# Patient Record
Sex: Male | Born: 1945 | Race: Black or African American | Hispanic: No | Smoking: Former smoker
Health system: Southern US, Community
[De-identification: ages and names within clinical notes are randomized; demographics above are authoritative.]

## PROBLEM LIST (undated history)

## (undated) DIAGNOSIS — N400 Enlarged prostate without lower urinary tract symptoms: Secondary | ICD-10-CM

## (undated) DIAGNOSIS — R339 Retention of urine, unspecified: Secondary | ICD-10-CM

## (undated) DIAGNOSIS — N200 Calculus of kidney: Secondary | ICD-10-CM

## (undated) DIAGNOSIS — E119 Type 2 diabetes mellitus without complications: Secondary | ICD-10-CM

## (undated) DIAGNOSIS — R319 Hematuria, unspecified: Secondary | ICD-10-CM

## (undated) DIAGNOSIS — I10 Essential (primary) hypertension: Secondary | ICD-10-CM

## (undated) HISTORY — DX: Calculus of kidney: N20.0

## (undated) HISTORY — DX: Essential (primary) hypertension: I10

## (undated) HISTORY — DX: Retention of urine, unspecified: R33.9

## (undated) HISTORY — DX: Hematuria, unspecified: R31.9

## (undated) HISTORY — PX: OTHER SURGICAL HISTORY: SHX169

## (undated) HISTORY — DX: Type 2 diabetes mellitus without complications: E11.9

## (undated) HISTORY — DX: Benign prostatic hyperplasia without lower urinary tract symptoms: N40.0

## (undated) HISTORY — PX: COLONOSCOPY: SHX174

---

## 2008-08-19 ENCOUNTER — Emergency Department: Payer: Self-pay | Admitting: Emergency Medicine

## 2011-04-08 ENCOUNTER — Observation Stay: Payer: Self-pay | Admitting: Internal Medicine

## 2014-03-14 ENCOUNTER — Ambulatory Visit: Payer: Self-pay | Admitting: Urology

## 2014-12-01 ENCOUNTER — Inpatient Hospital Stay: Payer: Self-pay | Admitting: Internal Medicine

## 2014-12-01 LAB — URINALYSIS, COMPLETE
Bilirubin,UR: NEGATIVE
Glucose,UR: NEGATIVE mg/dL (ref 0–75)
Ketone: NEGATIVE
NITRITE: NEGATIVE
Ph: 7 (ref 4.5–8.0)
SQUAMOUS EPITHELIAL: NONE SEEN
Specific Gravity: 1.018 (ref 1.003–1.030)

## 2014-12-01 LAB — CBC WITH DIFFERENTIAL/PLATELET
BASOS PCT: 0.5 %
Basophil #: 0.1 10*3/uL (ref 0.0–0.1)
Eosinophil #: 0 10*3/uL (ref 0.0–0.7)
Eosinophil %: 0.1 %
HCT: 39.8 % — ABNORMAL LOW (ref 40.0–52.0)
HGB: 13.1 g/dL (ref 13.0–18.0)
Lymphocyte #: 0.6 10*3/uL — ABNORMAL LOW (ref 1.0–3.6)
Lymphocyte %: 4.8 %
MCH: 28.5 pg (ref 26.0–34.0)
MCHC: 32.9 g/dL (ref 32.0–36.0)
MCV: 87 fL (ref 80–100)
MONO ABS: 0.9 x10 3/mm (ref 0.2–1.0)
MONOS PCT: 7 %
NEUTROS ABS: 11.1 10*3/uL — AB (ref 1.4–6.5)
NEUTROS PCT: 87.6 %
Platelet: 252 10*3/uL (ref 150–440)
RBC: 4.59 10*6/uL (ref 4.40–5.90)
RDW: 13.7 % (ref 11.5–14.5)
WBC: 12.6 10*3/uL — AB (ref 3.8–10.6)

## 2014-12-01 LAB — COMPREHENSIVE METABOLIC PANEL
ALBUMIN: 3.6 g/dL (ref 3.4–5.0)
ANION GAP: 8 (ref 7–16)
Alkaline Phosphatase: 79 U/L
BILIRUBIN TOTAL: 0.6 mg/dL (ref 0.2–1.0)
BUN: 37 mg/dL — ABNORMAL HIGH (ref 7–18)
CHLORIDE: 101 mmol/L (ref 98–107)
Calcium, Total: 9 mg/dL (ref 8.5–10.1)
Co2: 26 mmol/L (ref 21–32)
Creatinine: 2.63 mg/dL — ABNORMAL HIGH (ref 0.60–1.30)
EGFR (Non-African Amer.): 26 — ABNORMAL LOW
GFR CALC AF AMER: 32 — AB
GLUCOSE: 136 mg/dL — AB (ref 65–99)
Osmolality: 281 (ref 275–301)
Potassium: 4.2 mmol/L (ref 3.5–5.1)
SGOT(AST): 29 U/L (ref 15–37)
SGPT (ALT): 29 U/L
Sodium: 135 mmol/L — ABNORMAL LOW (ref 136–145)
TOTAL PROTEIN: 7.6 g/dL (ref 6.4–8.2)

## 2014-12-01 LAB — PROTIME-INR
INR: 1.3
PROTHROMBIN TIME: 15.9 s — AB (ref 11.5–14.7)

## 2014-12-01 LAB — TROPONIN I: Troponin-I: <0.02 ng/mL

## 2014-12-01 LAB — PHOSPHORUS: Phosphorus: 2.5 mg/dL (ref 2.5–4.9)

## 2014-12-01 LAB — MAGNESIUM: MAGNESIUM: 1.5 mg/dL — AB

## 2014-12-02 LAB — BASIC METABOLIC PANEL
Anion Gap: 6 — ABNORMAL LOW (ref 7–16)
BUN: 34 mg/dL — ABNORMAL HIGH (ref 7–18)
CHLORIDE: 107 mmol/L (ref 98–107)
CO2: 25 mmol/L (ref 21–32)
CREATININE: 2.01 mg/dL — AB (ref 0.60–1.30)
Calcium, Total: 7.9 mg/dL — ABNORMAL LOW (ref 8.5–10.1)
EGFR (African American): 43 — ABNORMAL LOW
EGFR (Non-African Amer.): 36 — ABNORMAL LOW
Glucose: 135 mg/dL — ABNORMAL HIGH (ref 65–99)
Osmolality: 285 (ref 275–301)
Potassium: 4.1 mmol/L (ref 3.5–5.1)
Sodium: 138 mmol/L (ref 136–145)

## 2014-12-02 LAB — CBC WITH DIFFERENTIAL/PLATELET
BASOS ABS: 0 10*3/uL (ref 0.0–0.1)
Basophil %: 0.3 %
EOS PCT: 0 %
Eosinophil #: 0 10*3/uL (ref 0.0–0.7)
HCT: 31.9 % — ABNORMAL LOW (ref 40.0–52.0)
HGB: 10.7 g/dL — AB (ref 13.0–18.0)
Lymphocyte #: 1.1 10*3/uL (ref 1.0–3.6)
Lymphocyte %: 10.9 %
MCH: 28.9 pg (ref 26.0–34.0)
MCHC: 33.4 g/dL (ref 32.0–36.0)
MCV: 86 fL (ref 80–100)
Monocyte #: 1 x10 3/mm (ref 0.2–1.0)
Monocyte %: 9.9 %
NEUTROS ABS: 7.8 10*3/uL — AB (ref 1.4–6.5)
NEUTROS PCT: 78.9 %
Platelet: 206 10*3/uL (ref 150–440)
RBC: 3.69 10*6/uL — AB (ref 4.40–5.90)
RDW: 13.8 % (ref 11.5–14.5)
WBC: 9.8 10*3/uL (ref 3.8–10.6)

## 2014-12-02 LAB — MAGNESIUM: Magnesium: 1.6 mg/dL — ABNORMAL LOW

## 2014-12-03 LAB — PSA: PSA: 3.1 ng/mL (ref 0.0–4.0)

## 2014-12-03 LAB — URINE CULTURE

## 2014-12-06 LAB — CULTURE, BLOOD (SINGLE)

## 2015-04-13 NOTE — H&P (Signed)
PATIENT NAME:  Vincent Bautista, Vincent Bautista MR#:  [id number] DATE OF BIRTH:  13-Jan-1946  DATE OF ADMISSION:  12/01/2014  REFERRING DOCTOR: Loraine Leriche R. Fanny Bien, MD  PRIMARY CARE DOCTOR: Abby D. Bender MD  ADMITTING DOCTOR: Crissie Figures, MD   CHIEF COMPLAINT: Came with the complaints of dizziness with a fall with a laceration to the left side of the head.  Found to be hypotensive on arrival to the Emergency Room, with a blood pressure of 74/46.  HISTORY OF PRESENT ILLNESS: A 69 year old African American male with a past medical history significant for hypertension, diabetes mellitus type 2, hyperlipidemia, benign prostatic hypertrophy. He came with the complaints of a fall following dizziness while he stood up, and sustained injury to the left side of the head. The patient stated that he has been having some ongoing urinary retention problems for the past few months which kind of increased this morning. Hence, he went to the [facility] Emergency Room, where he was evaluated by the ED physician and was placed a Foley catheter and was diagnosed with a urinary tract infection and the patient was sent home on p.o. Cipro and finasteride and was advised to be followed up by a urologist and his primary care doctor. The patient went home and was doing, apparently, well, and this evening while he was trying to stand up he suddenly felt dizzy and he fell down and sustained injury to the left side of the forehead. The patient did not lose any consciousness. Denies any chest pain, shortness of breath. No fever, no chills. No nausea, no vomiting, no diarrhea. As mentioned earlier, the patient has been having retention of the urine for the past few months and being evaluated by his primary care physician. In the Emergency Room, the patient on arrival was found to be hypotensive, with a blood pressure of 74/46, and the workup revealed urinary tract infection with elevated white blood cell count of 12.6 and elevated creatinine of 2.6. The  patient was given IV fluids, following which his blood pressure improved, and currently his blood pressure is 101/38. The patient denies any dizziness at this time and he is comfortably lying in the bed. Denies any other complaints such as chest pain, shortness of breath, palpitations. The patient has a Foley placed which is draining some dark-colored urine.  PAST MEDICAL HISTORY: 1.  Diabetes mellitus type 2.  2.  Hypertension.  3.  Hyperlipidemia.  4.  Benign prostatic hypertrophy.   PAST SURGICAL HISTORY: Nil significant.   ALLERGIES: CODEINE.   HOME MEDICATIONS: 1.  Lisinopril/hydrochlorothiazide 20/12.5 mg 2 tablets orally per day.  2.  Metformin 500 mg 1 tablet orally twice a day.  3.  Simvastatin 20 mg tablet 1 tablet orally once a day.  4.  Finasteride 5 mg tablet 1 tablet orally once a day, which has not started. The patient also did not start Cipro which was given at [facility] Emergency Room earlier today.  FAMILY HISTORY: Mother with CVA, hypertension, and heart disease. Father with colon cancer. Brother with prostate cancer and colon cancer and another brother with heart disease.   SOCIAL HISTORY: He is single. He works as a Engineer, water in the buildings. No history of smoking, alcohol or substance abuse.  REVIEW OF SYSTEMS: CONSTITUTIONAL: Negative for fever. He felt dizzy and weak while standing up in the evening at home, following which he fell down and sustained injury to the left side of the forehead.  EYES: Negative for blurred vision, double vision. No pain.  No redness. No inflammation.  EARS, NOSE, AND THROAT: Negative for tinnitus, ear pain, hearing loss. No epistaxis. No discharge. No difficulty swallowing.  RESPIRATORY: Negative for cough, wheezing, hemoptysis, dyspnea, painful respirations.  CARDIOVASCULAR: Negative for chest pain, orthopnea, palpitations, syncope. No pedal edema. As mentioned in the HPI, he felt dizzy when he stood up, but did not lose consciousness.   GASTROINTESTINAL: Negative for nausea, vomiting, diarrhea, abdominal pain, hematemesis, melena, GERD symptoms, or rectal bleeding.  GENITOURINARY: Ongoing urinary retention for the past few months, and he has been having intermittent dysuria for which he went to the [facility] Emergency Room today, was evaluated by the ED physician and was found to have acute urinary retention and urinary tract infection for which a Foley catheter was placed and he was prescribed Cipro. Currently having Foley catheter, which is draining dark-colored urine.  ENDOCRINE: Negative for polyuria, nocturia. No heat or cold intolerance.  HEMATOLOGIC: Negative for anemia, easy bruising or bleeding, swollen glands.  INTEGUMENTARY: Negative for acne, skin rash, lesions.  MUSCULOSKELETAL: Negative for arthritis, joint swelling, gout.  NEUROLOGICAL: Negative for focal weakness or numbness. No history of CVA, TIA, seizure disorder.  PSYCHIATRIC: Negative for anxiety, insomnia, depression.   PHYSICAL EXAMINATION:  VITAL SIGNS: On arrival to the Emergency Room, temperature of 98.6 degrees Fahrenheit, pulse rate 88 per minute, respirations 19, blood pressure 74/46, oxygen saturation is 99% on room air. Current blood pressure 101/38, pulse rate 80 per minute, respirations 16 per minute.  GENERAL: Well developed, well nourished, alert, awake, and oriented, very pleasant and cooperative for examination. In no acute distress, comfortably lying in the bed.  HEAD: A 2-inch long, superficial lacerated injury over the left eyebrow present. No active bleeding.  EYES: Pupils are equal and reactive to light. No conjunctival pallor. No scleral icterus. Extraocular movements intact.  NOSE: No nasal lesions. No drainage.  EARS: No drainage. No external lesions.  ORAL CAVITY: No mucosal lesions. No exudates. Poor oral dentition.  NECK: Supple. No JVD. No thyromegaly. No carotid bruit. Range of motion of neck normal. RESPIRATORY: Good respiratory  effort. Clear to auscultation bilaterally. No rales or rhonchi.  CARDIOVASCULAR: S1, S2 regular. No murmurs, clicks, or gallops appreciated. Peripheral pulses equal at carotid, femoral, and pedal pulses. No peripheral edema.  GASTROINTESTINAL: Abdomen soft, nontender. No hepatosplenomegaly. Bowel sounds present and equal in all 4 quadrants. No rebound. No guarding. No rigidity.  GENITOURINARY: Deferred.  MUSCULOSKELETAL: Range of motion adequate in all areas. Strength and tone equal bilaterally in both upper and lower extremities.  SKIN: Inspection within normal limits.  LYMPHATIC: No cervical lymphadenopathy.  VASCULAR: Good dorsalis pedis and posterior tibial pulses.  NEUROLOGICAL: Alert, awake, and oriented x 3. Cranial nerves II through XII grossly intact. DTRs are 2+ bilaterally and symmetrical. Motor strength is 5/5 in both upper and lower extremities.  PSYCHIATRIC: Judgment and insight are adequate. Alert and oriented x 3. Memory and mood within normal limits.   LABORATORY DATA: Serum glucose 136, BUN 37, creatinine 2.63, stool in one, sodium 135, potassium 4.2, chloride 101, bicarbonate 26, estimated GFR 32, total calcium 9.0, serum phosphorus 2.5, magnesium 1.5, total protein 7.6, albumin 3.6, total bilirubin 0.6, alkaline phosphatase 79, AST 20 and ALT 29. Troponin less than 0.02. WBC 12.6, hemoglobin 13.1, hematocrit 39.8, platelet count 252,000. Neutrophils 11.1, elevated. Prothrombin time 15.9, INR 1.3. Urinalysis: Turbid, 2+ blood. Protein 100 mg. RBCs 852 per high-power field, WBC 2411, bacteria 1+, WBC present.  Lactic acid venous 2.5.   IMAGING STUDIES:  Chest x-ray: No active disease.   CT of the head, noncontrast study: No acute intracranial abnormalities. Mild chronic atrophy and small-vessel ischemic changes.   EKG: Normal sinus rhythm with ventricular rate of 92 beats per minute. Nonspecific ST-T changes.   ASSESSMENT AND PLAN: A 69 year old African American male with a  past medical history of diabetes mellitus type 2, hypertension, hyperlipidemia, benign prostatic hypertrophy who presents with complaints of dizziness with subsequent fall with injury to the left side of the face, was found to be hypotensive on arrival, further workup revealed urosepsis and hypotension secondary to sepsis. 1.  Sepsis that is urosepsis secondary due to urinary tract infection. Plan: Admit to medical floor. Pan cultures. Continue intravenous Zosyn. Follow cultures and modify antibiotics accordingly. 2.  Hypotension secondary due to urosepsis. Received intravenous fluids in the Emergency Room, following which blood pressure improving. The patient without any symptoms at this time. Continue intravenous hydration. Follow blood pressure monitoring. We hold all blood pressure medications for now.  3.  Ongoing urinary retention with a history of benign prostatic hypertrophy for the past few months. [facility] and a Foley was placed, and was also diagnosed to have a UTI. Continue Foley catheter with drainage. Check PSA and order KUB of ultrasound and further workup depending on the results. May need urology consultation.  4.  Renal failure. Not sure whether acute or chronic. Prior creatinine not known. Most likely in acute renal failure. Plan: Check ultrasound of KUB. Avoid nephrotoxic agents. IV hydration. Monitor BMP closely.  5.  History of hypertension. Will hold all blood pressure medications because of hypotension for now. Monitor blood pressure closely and consider restarting blood pressure medications when blood pressure is better.  6.  Diabetes mellitus type 2 on metformin. Will hold off metformin because of acute kidney injury. Will place him on sliding scale insulin. Monitor blood sugars closely. Follow up accordingly. 7.  History of hyperlipidemia on simvastatin. Continue same.  8.  Hypomagnesemia. Replace magnesium. Follow up levels. 9.  Deep vein thrombosis  prophylaxis. Subcutaneous Lovenox.  10.  Gastrointestinal prophylaxis. Protonix.   CODE STATUS: Full code.   TIME SPENT: 55 minutes.   ____________________________ Crissie FiguresEdavally N. Katlynne Mckercher, MD enr:ST D: 12/02/2014 00:09:47 ET T: 12/02/2014 00:47:20 ET JOB#: [id number]  cc: Crissie FiguresEdavally N. Tom Macpherson, MD, <Dictator> Abby D. Hessie DienerBender MD Crissie FiguresEDAVALLY N Merlean Pizzini MD ELECTRONICALLY SIGNED 12/02/2014 18:53

## 2015-04-17 NOTE — Discharge Summary (Signed)
PATIENT NAME:  Vincent Bautista, [name] MR#:  [id number] DATE OF BIRTH:  04-18-1946  DATE OF ADMISSION:  12/01/2014 DATE OF DISCHARGE:  12/03/2014  PRESENTING COMPLAINT: Dizziness with fall and laceration of the left side of the head.   PRIMARY CARE PHYSICIAN: Abby D. Hessie DienerBender, MD.   DISCHARGE DIAGNOSES: 1. Acute cystitis.  2. Hypertension with falls.  3. Benign prostatic hypertrophy.  4. 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   CODE STATUS: Full code.   MEDICATIONS: 1. Aspirin 81 mg daily.  2. Pravachol 20 mg at bedtime.  3. Finasteride 5 mg daily.  4. Hydrochlorothiazide/lisinopril 12.5/20 one tablet daily.  5. Glipizide 5 mg daily.  6. Keflex 500 mg b.i.d.   FOLLOW UP: With your primary care physician in 1 to 2 weeks.   LABORATORY DATA: At discharge: Hemoglobin and hematocrit is 10.7 and 31.9. Creatinine is 2.01, sodium is 138, potassium is 4.1. Blood cultures negative in 5 days. White count is 12.6. Phosphorus 2.5. UA positive for UTI. Urine culture negative in 36 hours.   DIAGNOSTIC DATA: CT of the head: Mild diffuse cerebral atrophy. No intracranial abnormalities. EKG shows sinus rhythm, nonspecific ST-T abnormality.   HOSPITAL COURSE: [name] is a 69 year old gentleman with history of dizziness and fall, hypotensive on arrival. [facility], diagnosed to have UTI and sent home with antibiotics. He came in with symptoms of urinary retention, ongoing for the past few months. He was admitted with:  1. Sepsis secondary to UTI with hypotension and elevated white count. He was started on IV Rocephin and changed to p.o. Keflex at discharge. White count is normal. No fever documented. His urine culture did not grow anything after 36 hours.  2. Hypotension secondary to sepsis. Received IV fluids. Blood pressure improved. Resumed hydrochlorothiazide and lisinopril daily. His dose was adjusted to 1 tablet daily.  3. Ongoing urinary retention with history of BPH. The patient will  Foley catheter in place. Continue Foley. Will set up an appointment with Dr. Vanna ScotlandAshley Brandon as outpatient for urology follow up.  4. Renal failure, acute, appears most likely chronic although prior creatinine is not known. IV hydration was continued. The patient [facility] check on his metabolic panel after discharge.  5. Hypertension, on his home medications. Dosage was adjusted.  6. Type 2 diabetes. Metformin was discontinued secondary to elevated creatinine and p.o. glipizide was started.  7. Hyperlipidemia, on simvastatin.  8. Hypomagnesemia. Magnesium was replaced.   Hospital stay otherwise remained stable. The patient remained a full code.   TIME SPENT: 40 minutes.    ____________________________ Wylie HailSona A. Allena KatzPatel, MD sap:TT D: 12/18/2014 17:24:22 ET T: 12/18/2014 19:08:07 ET JOB#: [id number]  cc: Lachlyn Vanderstelt A. Allena KatzPatel, MD, <Dictator> Abby D. Hessie DienerBender MD Willow OraSONA A Burrell Hodapp MD ELECTRONICALLY SIGNED 12/25/2014 11:08

## 2015-06-03 ENCOUNTER — Telehealth: Payer: Self-pay

## 2015-06-03 NOTE — Telephone Encounter (Signed)
Can pt have refills on tamsulosin? Pt has not been seen in12/23/15. Please adivse. Cw,lpn

## 2015-06-03 NOTE — Telephone Encounter (Signed)
Per Dr. Delana Meyer note, patient was to follow-up in 2 months he did not. Okay to refill tamsulosin for 1 month but patient must make an appointment in month.

## 2015-06-03 NOTE — Telephone Encounter (Signed)
Pt hung up on nurse when trying to ask about medication and needing refills. Cw,lpn

## 2015-06-06 NOTE — Telephone Encounter (Signed)
Unable to reach pt. Therefore a letter was sent in reference to medication. Cw,lpn

## 2016-04-20 ENCOUNTER — Ambulatory Visit: Payer: Self-pay | Admitting: Urology

## 2016-05-29 ENCOUNTER — Encounter: Payer: Self-pay | Admitting: *Deleted

## 2016-06-01 ENCOUNTER — Encounter: Payer: Self-pay | Admitting: Urology

## 2016-06-01 ENCOUNTER — Ambulatory Visit (INDEPENDENT_AMBULATORY_CARE_PROVIDER_SITE_OTHER): Payer: Medicare Other | Admitting: Urology

## 2016-06-01 VITALS — BP 132/85 | HR 90 | Ht 64.0 in | Wt 158.0 lb

## 2016-06-01 DIAGNOSIS — Z87898 Personal history of other specified conditions: Secondary | ICD-10-CM

## 2016-06-01 DIAGNOSIS — N401 Enlarged prostate with lower urinary tract symptoms: Secondary | ICD-10-CM

## 2016-06-01 DIAGNOSIS — N400 Enlarged prostate without lower urinary tract symptoms: Secondary | ICD-10-CM | POA: Diagnosis not present

## 2016-06-01 DIAGNOSIS — R338 Other retention of urine: Principal | ICD-10-CM

## 2016-06-01 DIAGNOSIS — N528 Other male erectile dysfunction: Secondary | ICD-10-CM | POA: Diagnosis not present

## 2016-06-01 DIAGNOSIS — Z87448 Personal history of other diseases of urinary system: Secondary | ICD-10-CM

## 2016-06-01 DIAGNOSIS — N529 Male erectile dysfunction, unspecified: Secondary | ICD-10-CM

## 2016-06-01 LAB — BLADDER SCAN AMB NON-IMAGING

## 2016-06-01 MED ORDER — TAMSULOSIN HCL 0.4 MG PO CAPS: 0.4000 mg | ORAL_CAPSULE | Freq: Every day | ORAL | Status: DC

## 2016-06-01 MED ORDER — FINASTERIDE 5 MG PO TABS: 5.0000 mg | ORAL_TABLET | Freq: Every day | ORAL | Status: DC

## 2016-06-01 NOTE — Progress Notes (Addendum)
06/01/2016 3:44 PM   Vincent Bautista Vincent Bautista 1946/02/29 [id number]  Referring provider: No referring provider defined for this encounter.  Chief Complaint  Patient presents with  . Benign Prostatic Hypertrophy    follow up    HPI: Patient is a 70 year old African American male who presents today as a referral from his primary care physician Dr. Tonia Bautista for follow-up for history of urinary retention, erectile dysfunction and BPH with LUTS.  History of urinary retention Patient was found to be in urinary retention after suffering a TIA in December 2015. Patient was initiated on finasteride and Flomax. He did undergo cystoscopically in June 2015 and was found to have an enlarged prostate and a bladder diverticulum with hypospadias.  He states he is voiding well, but his PVR at today's exam is 514 mL.  I do not have a recent serum creatinine available to me at this visit.  Erectile dysfunction His SHIM score is 8, which is moderate ED.   He has been having difficulty with erections for the last several years.   His major complaint is achieving an erection.  His libido is preserved.   His risk factors for ED are age, BPH, HTN, and DM.  He denies any painful erections or curvatures with his erections.   He has tried Viagra in the past unsatisfactory results.       SHIM      06/01/16 1524       SHIM: Over the last 6 months:   How do you rate your confidence that you could get and keep an erection? Low     When you had erections with sexual stimulation, how often were your erections hard enough for penetration (entering your partner)? A Few Times (much less than half the time)     During sexual intercourse, how often were you able to maintain your erection after you had penetrated (entered) your partner? Very Difficult     During sexual intercourse, how difficult was it to maintain your erection to completion of intercourse? Extremely Difficult     When you attempted sexual intercourse, how  often was it satisfactory for you? Extremely Difficult     SHIM Total Score   SHIM 8        Score: 1-7 Severe ED 8-11 Moderate ED 12-16 Mild-Moderate ED 17-21 Mild ED 22-25 No ED   BPH WITH LUTS His IPSS score today is 0, which is no lower urinary tract symptomatology. He is mostly satisfied with his quality life due to his urinary symptoms. His PVR is 514 mL.  He denies any dysuria, hematuria or suprapubic pain.  He currently taking tamsulosin and finasteride.  He also denies any recent fevers, chills, nausea or vomiting.  He has a family history of prostate cancer in his brother.        IPSS      06/01/16 1500       International Prostate Symptom Score   How often have you had the sensation of not emptying your bladder? Not at All     How often have you had to urinate less than every two hours? Not at All     How often have you found you stopped and started again several times when you urinated? Not at All     How often have you found it difficult to postpone urination? Not at All     How often have you had a weak urinary stream? Not at All  How often have you had to strain to start urination? Not at All     How many times did you typically get up at night to urinate? None     Total IPSS Score 0     Quality of Life due to urinary symptoms   If you were to spend the rest of your life with your urinary condition just the way it is now how would you feel about that? Mostly Satisfied        Score:  1-7 Mild 8-19 Moderate 20-35 Severe     PMH: Past Medical History  Diagnosis Date  . Diabetes mellitus, type 2 (HCC)   . HTN (hypertension)   . Bilateral kidney stones   . Hematuria     gross  . Urinary retention   . BPH (benign prostatic hypertrophy)     Surgical History: Past Surgical History  Procedure Laterality Date  . None      Home Medications:    Medication List       This list is accurate as of: 06/01/16  3:44 PM.  Always use your most recent med  list.               aspirin 81 MG tablet  Take 81 mg by mouth daily.     finasteride 5 MG tablet  Commonly known as:  PROSCAR  Take 1 tablet (5 mg total) by mouth daily.     glipiZIDE 5 MG tablet  Commonly known as:  GLUCOTROL     hydrochlorothiazide 25 MG tablet  Commonly known as:  HYDRODIURIL     lisinopril 10 MG tablet  Commonly known as:  PRINIVIL,ZESTRIL     simvastatin 20 MG tablet  Commonly known as:  ZOCOR     tamsulosin 0.4 MG Caps capsule  Commonly known as:  FLOMAX  Take 1 capsule (0.4 mg total) by mouth daily.        Allergies:  Allergies  Allergen Reactions  . Codeine     Family History: Family History  Problem Relation Age of Onset  . Colon cancer Brother   . Cancer    . Prostate cancer Brother   . Lung cancer Father     Social History:  reports that he has quit smoking. He does not have any smokeless tobacco history on file. He reports that he does not drink alcohol. His drug history is not on file.  ROS: UROLOGY Frequent Urination?: No Hard to postpone urination?: No Burning/pain with urination?: No Get up at night to urinate?: No Leakage of urine?: No Urine stream starts and stops?: No Trouble starting stream?: No Do you have to strain to urinate?: No Blood in urine?: No Urinary tract infection?: No Sexually transmitted disease?: No Injury to kidneys or bladder?: No Painful intercourse?: No Weak stream?: No Erection problems?: No Penile pain?: No  Gastrointestinal Nausea?: No Vomiting?: No Indigestion/heartburn?: No Diarrhea?: No Constipation?: No  Constitutional Fever: No Night sweats?: No Weight loss?: No Fatigue?: No  Skin Skin rash/lesions?: No Itching?: No  Eyes Blurred vision?: No Double vision?: No  Ears/Nose/Throat Sore throat?: No Sinus problems?: No  Hematologic/Lymphatic Swollen glands?: No Easy bruising?: No  Cardiovascular Leg swelling?: No Chest pain?: No  Respiratory Cough?:  No Shortness of breath?: Yes  Endocrine Excessive thirst?: No  Musculoskeletal Back pain?: No Joint pain?: No  Neurological Headaches?: No Dizziness?: No  Psychologic Depression?: No Anxiety?: No  Physical Exam: BP 132/85 mmHg  Pulse 90  Ht 5\' 4"  (  1.626 m)  Wt 158 lb (71.668 kg)  BMI 27.11 kg/m2  Constitutional: Well nourished. Alert and oriented, No acute distress. HEENT: [place] AT, moist mucus membranes. Trachea midline, no masses. Cardiovascular: No clubbing, cyanosis, or edema. Respiratory: Normal respiratory effort, no increased work of breathing. GI: Abdomen is soft, non tender, non distended, no abdominal masses. Liver and spleen not palpable.  No hernias appreciated.  Stool sample for occult testing is not indicated.   GU: No CVA tenderness.  No bladder fullness or masses.  Patient with circumcised phallus. Coronal hypospadies.   Urethral meatus is patent.  No penile discharge. No penile lesions or rashes. Scrotum without lesions, cysts, rashes and/or edema.  Testicles are located scrotally bilaterally. No masses are appreciated in the testicles. Left and right epididymis are normal. Rectal: Patient with  normal sphincter tone. Anus and perineum without scarring or rashes. No rectal masses are appreciated. Prostate is approximately 60 grams, DRE limited to patient's body habitus, no nodules are appreciated. Seminal vesicles are normal. Skin: No rashes, bruises or suspicious lesions. Lymph: No cervical or inguinal adenopathy. Neurologic: Grossly intact, no focal deficits, moving all 4 extremities. Psychiatric: Normal mood and affect.  Laboratory Data: Lab Results  Component Value Date   WBC 9.8 12/02/2014   HGB 10.7* 12/02/2014   HCT 31.9* 12/02/2014   MCV 86 12/02/2014   PLT 206 12/02/2014    Lab Results  Component Value Date   CREATININE 2.01* 12/02/2014    Lab Results  Component Value Date   PSA 3.1 12/01/2014     Lab Results  Component Value Date    AST 29 12/01/2014   Lab Results  Component Value Date   ALT 29 12/01/2014     Pertinent Imaging: Results for AYAAN, SHUTES (MRN [id number]) as of 06/02/2016 23:27  Ref. Range 06/01/2016 15:14  Scan Result Unknown    Assessment & Plan:    1. History of urinary retention:   Patient had an episode of urinary retention in 2015. A cystoscopy noted an enlarged prostate with bladder diverticulum. He is still on finasteride and Flomax, but his PVR is still large.  He is not wanting a Foley catheter placed at this time. I will have the patient stop tamsulosin and start Cialis 5 mg samples to see if this can help facilitate better emptying of his bladder.   2. Erectile dysfunction:   SHIM score is 8.   I explained to the patient that in order to achieve an erection it takes good functioning of the nervous system (parasympathetic, sympathetic, sensory and motor), good blood flow into the erectile tissue of the penis and a desire to have sex.   I stated that conditions like diabetes, hypertension, coronary artery disease, peripheral vascular disease, smoking, alcohol consumption, age and BPH can diminish the ability to have an erection.   We discussed trying a different PDE5 inhibitor, intra-urethral suppositories, intracavernous vasoactive drug injection therapy, vacuum constriction device and penile prosthesis implantation.  We discussed trying a different PDE5 inhibitor, intra-urethral suppositories, intracavernous vasoactive drug injection therapy, vacuum constriction device and penile prosthesis implantation.  We will try Cialis at this time. He'll be returning in 1 month for [facility] IM score and exam.   3. BPH (benign prostatic hyperplasia) with urinary retention:   IPSS score is 0/2.  I explained to the patient the ramifications of having an large postvoid residual, such as urinary tract infections, bladder calculi and renal failure. I will be checking a BUN and creatinine  at today's visit. He will  continue the finasteride 5 mg daily, he will discontinue the tamsulosin 0.4 mg daily and start Cialis 5 mg daily to see if we can reduce his residuals. He will follow-up in one month for I PSS score and PVR.  - PSA - BLADDER SCAN AMB NON-IMAGING   Return in about 1 month (around 07/01/2016) for IPSS, SHIM and PVR.  These notes generated with voice recognition software. I apologize for typographical errors.  Michiel CowboySHANNON Janit Cutter, PA-C  [facility] [address], [address] [place], [place] [address] [phone]

## 2016-06-02 ENCOUNTER — Telehealth: Payer: Self-pay

## 2016-06-02 LAB — BUN+CREAT
BUN/Creatinine Ratio: 16 (ref 10–24)
BUN: 21 mg/dL (ref 8–27)
CREATININE: 1.32 mg/dL — AB (ref 0.76–1.27)
GFR calc Af Amer: 64 mL/min/{1.73_m2} (ref >59)
GFR calc non Af Amer: 55 mL/min/{1.73_m2} — ABNORMAL LOW (ref >59)

## 2016-06-02 LAB — PSA: Prostate Specific Ag, Serum: 0.2 ng/mL (ref 0.0–4.0)

## 2016-06-02 NOTE — Telephone Encounter (Signed)
-----   Message from Harle BattiestShannon A McGowan, PA-C sent at 06/02/2016  8:09 AM EDT ----- Lab work is normal.  We will see him in one month.

## 2016-06-02 NOTE — Telephone Encounter (Signed)
Spoke with pt in reference lab results. Pt voiced understanding.  

## 2016-06-14 ENCOUNTER — Emergency Department
Admission: EM | Admit: 2016-06-14 | Discharge: 2016-06-14 | Disposition: A | Discharge status: Home / Self Care | Payer: Medicare Other | Attending: Emergency Medicine | Admitting: Emergency Medicine

## 2016-06-14 DIAGNOSIS — Z87891 Personal history of nicotine dependence: Secondary | ICD-10-CM | POA: Insufficient documentation

## 2016-06-14 DIAGNOSIS — R350 Frequency of micturition: Secondary | ICD-10-CM | POA: Diagnosis present

## 2016-06-14 DIAGNOSIS — Z7984 Long term (current) use of oral hypoglycemic drugs: Secondary | ICD-10-CM | POA: Insufficient documentation

## 2016-06-14 DIAGNOSIS — R338 Other retention of urine: Secondary | ICD-10-CM

## 2016-06-14 DIAGNOSIS — I10 Essential (primary) hypertension: Secondary | ICD-10-CM | POA: Diagnosis not present

## 2016-06-14 DIAGNOSIS — Z79899 Other long term (current) drug therapy: Secondary | ICD-10-CM | POA: Insufficient documentation

## 2016-06-14 DIAGNOSIS — N401 Enlarged prostate with lower urinary tract symptoms: Secondary | ICD-10-CM | POA: Diagnosis not present

## 2016-06-14 DIAGNOSIS — N3090 Cystitis, unspecified without hematuria: Secondary | ICD-10-CM | POA: Diagnosis not present

## 2016-06-14 DIAGNOSIS — Z7982 Long term (current) use of aspirin: Secondary | ICD-10-CM | POA: Insufficient documentation

## 2016-06-14 DIAGNOSIS — E119 Type 2 diabetes mellitus without complications: Secondary | ICD-10-CM | POA: Diagnosis not present

## 2016-06-14 LAB — COMPREHENSIVE METABOLIC PANEL
ALT: 16 U/L — ABNORMAL LOW (ref 17–63)
AST: 21 U/L (ref 15–41)
Albumin: 4 g/dL (ref 3.5–5.0)
Alkaline Phosphatase: 78 U/L (ref 38–126)
Anion gap: 9 (ref 5–15)
BUN: 24 mg/dL — ABNORMAL HIGH (ref 6–20)
CO2: 27 mmol/L (ref 22–32)
Calcium: 9.3 mg/dL (ref 8.9–10.3)
Chloride: 103 mmol/L (ref 101–111)
Creatinine, Ser: 1.39 mg/dL — ABNORMAL HIGH (ref 0.61–1.24)
GFR calc Af Amer: 59 mL/min — ABNORMAL LOW (ref >60)
GFR calc non Af Amer: 51 mL/min — ABNORMAL LOW (ref >60)
Glucose, Bld: 119 mg/dL — ABNORMAL HIGH (ref 65–99)
Potassium: 3.4 mmol/L — ABNORMAL LOW (ref 3.5–5.1)
Sodium: 139 mmol/L (ref 135–145)
Total Bilirubin: 0.4 mg/dL (ref 0.3–1.2)
Total Protein: 7.2 g/dL (ref 6.5–8.1)

## 2016-06-14 LAB — URINALYSIS COMPLETE WITH MICROSCOPIC (ARMC ONLY)
Bilirubin Urine: NEGATIVE
GLUCOSE, UA: NEGATIVE mg/dL
Ketones, ur: NEGATIVE mg/dL
Nitrite: NEGATIVE
PROTEIN: NEGATIVE mg/dL
SQUAMOUS EPITHELIAL / LPF: NONE SEEN
Specific Gravity, Urine: 1.017 (ref 1.005–1.030)
pH: 5 (ref 5.0–8.0)

## 2016-06-14 MED ORDER — CEPHALEXIN 500 MG PO CAPS: 500.0000 mg | ORAL_CAPSULE | Freq: Two times a day (BID) | ORAL | Status: DC

## 2016-06-14 MED ORDER — CEPHALEXIN 500 MG PO CAPS
500.0000 mg | ORAL_CAPSULE | Freq: Once | ORAL | Status: AC
  Administered 2016-06-14: 500 mg via ORAL
  Filled 2016-06-14: qty 1

## 2016-06-14 NOTE — ED Notes (Signed)
Patient is changing into gown.

## 2016-06-14 NOTE — ED Notes (Signed)
Patient educated on Foley use and need for frequent dumping of urine from leg bag.  Patient voiced understanding.

## 2016-06-14 NOTE — ED Provider Notes (Addendum)
[facility] Emergency Department Provider Note  ____________________________________________  Time seen: 1:30 AM  I have reviewed the triage vital signs and the nursing notes.   HISTORY  Chief Complaint Abdominal Pain and Urinary Retention    HPI Vincent Bautista is a 70 y.o. male complaining of gradual onset worsening suprapubic pain. Nonradiating. No aggravating or alleviating factors. Has frequent urination of small volumes. Recently saw urology was restarted on finasteride, also Cialis for erectile dysfunction. He has a history of hypospadias. 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   No fevers chills chest pain shortness of breath or vomiting.    Past Medical History  Diagnosis Date  . Diabetes mellitus, type 2 (HCC)   . HTN (hypertension)   . Bilateral kidney stones   . Hematuria     gross  . Urinary retention   . BPH (benign prostatic hypertrophy)      There are no active problems to display for this patient.    Past Surgical History  Procedure Laterality Date  . None       Current Outpatient Rx  Name  Route  Sig  Dispense  Refill  . aspirin 81 MG tablet   Oral   Take 81 mg by mouth daily.         . cephALEXin (KEFLEX) 500 MG capsule   Oral   Take 1 capsule (500 mg total) by mouth 2 (two) times daily.   14 capsule   0   . finasteride (PROSCAR) 5 MG tablet   Oral   Take 1 tablet (5 mg total) by mouth daily.   90 tablet   3   . glipiZIDE (GLUCOTROL) 5 MG tablet               . hydrochlorothiazide (HYDRODIURIL) 25 MG tablet               . lisinopril (PRINIVIL,ZESTRIL) 10 MG tablet               . simvastatin (ZOCOR) 20 MG tablet               . tamsulosin (FLOMAX) 0.4 MG CAPS capsule   Oral   Take 1 capsule (0.4 mg total) by mouth daily.   90 capsule   3       Allergies Codeine   Family History  Problem Relation Age of Onset  . Colon cancer Brother   . Cancer    . Prostate cancer Brother   . Lung cancer Father     Social History Social History  Substance Use Topics  . Smoking status: Former Games developer  . Smokeless tobacco: Not on file  . Alcohol Use: No    Review of Systems  Constitutional:   No fever or chills.  Eyes:   No vision changes.  ENT:   No sore throat. No rhinorrhea. Cardiovascular:   No chest pain. Respiratory:   No dyspnea or cough. Gastrointestinal:   Negative for abdominal pain, vomiting and diarrhea.  Genitourinary:   Frequency, difficulty with stream, suprapubic pain. Musculoskeletal:   Negative for focal pain or swelling Neurological:   Negative for headaches 10-point ROS otherwise negative.  ____________________________________________   PHYSICAL EXAM:  VITAL SIGNS: ED Triage Vitals  Enc Vitals Group     BP 06/14/16 0033 129/65 mmHg  Pulse Rate 06/14/16 0033 79     Resp 06/14/16 0033 18     Temp 06/14/16 0033 98.3 F (36.8 C)     Temp Source 06/14/16 0033 Oral     SpO2 06/14/16 0033 96 %     Weight 06/14/16 0033 160 lb (72.576 kg)     Height 06/14/16 0033 5\' 4"  (1.626 m)     Head Cir --      Peak Flow --      Pain Score 06/14/16 0035 2     Pain Loc --      Pain Edu? --      Excl. in GC? --     Vital signs reviewed, nursing assessments reviewed.   Constitutional:   Alert and oriented. Well appearing and in no distress. Eyes:   No scleral icterus. No conjunctival pallor. PERRL. EOMI.  No nystagmus. ENT   Head:   Normocephalic and atraumatic.   Nose:   No congestion/rhinnorhea. No septal hematoma   Mouth/Throat:   MMM, no pharyngeal erythema. No peritonsillar mass.    Neck:   No stridor. No SubQ emphysema. No meningismus. Hematological/Lymphatic/Immunilogical:   No cervical lymphadenopathy. Cardiovascular:   RRR. Symmetric bilateral radial and DP pulses.  No murmurs.   Respiratory:   Normal respiratory effort without tachypnea nor retractions. Breath sounds are clear and equal bilaterally. No wheezes/rales/rhonchi. Gastrointestinal:   Soft With suprapubic tenderness and fullness. Non distended. There is no CVA tenderness.  No rebound, rigidity, or guarding. Genitourinary:   Hypospadias otherwise normal external genitalia, nontender Musculoskeletal:   Nontender with normal range of motion in all extremities. No joint effusions.  No lower extremity tenderness.  No edema. Neurologic:   Normal speech and language.  CN 2-10 normal. Motor grossly intact. No gross focal neurologic deficits are appreciated.  Skin:    Skin is warm, dry and intact. No rash noted.  No petechiae, purpura, or bullae.  ____________________________________________    LABS (pertinent positives/negatives) (all labs ordered are listed, but only abnormal results are displayed) Labs Reviewed  COMPREHENSIVE METABOLIC PANEL - Abnormal; Notable for the following:    Potassium 3.4 (*)    Glucose, Bld 119 (*)    BUN 24 (*)    Creatinine, Ser 1.39 (*)    ALT 16 (*)    GFR calc non Af Amer 51 (*)    GFR calc Af Amer 59 (*)    All other components within normal limits  URINALYSIS COMPLETEWITH MICROSCOPIC ([facility] ONLY) - Abnormal; Notable for the following:    Color, Urine YELLOW (*)    APPearance CLOUDY (*)    Hgb urine dipstick 1+ (*)    Leukocytes, UA 3+ (*)    Bacteria, UA RARE (*)    All other components within normal limits  URINE CULTURE   ____________________________________________   EKG    ____________________________________________    RADIOLOGY    ____________________________________________   PROCEDURES   ____________________________________________   INITIAL IMPRESSION / ASSESSMENT AND PLAN / ED COURSE  Pertinent labs & imaging results that were available during my care of the patient were reviewed by me and considered in my medical decision making  (see chart for details).  Patient well appearing no acute distress. Vital signs unremarkable. Urinalysis consistent with UTI. We'll place Foley to help with drainage if possible in the setting of his hypospadias. Start oral Keflex. Follow-up with urology in 3-5 days. Urine culture sent.  ----------------------------------------- 2:01 AM on 06/14/2016 -----------------------------------------  Foley successfully placed by RN,  using an 8 [place] catheter. Bladder completely drained with free-flowing urine. Patient feels much better. No evidence of sepsis or pyelonephritis. Continue Keflex, follow-up with urology this week. Attempted to educate patient on the reasoning for Cialis that the urologist prescribed for him due to erectile dysfunction, and that it appears the urology clinic had recommended a Foley catheter during his recent visit that he refused. The patient seems to have limited insight into this. I strongly encouraged him to follow up closely with urology to continue addressing these medical issues. Patient is very well-appearing, comfortable, tolerating oral intake.     ____________________________________________   FINAL CLINICAL IMPRESSION(S) / ED DIAGNOSES  Final diagnoses:  Urinary retention due to benign prostatic hyperplasia  Cystitis       Portions of this note were generated with dragon dictation software. Dictation errors may occur despite best attempts at proofreading.   Sharman CheekPhillip Vernie Piet, MD 06/14/16 [id number]  Sharman CheekPhillip Reilyn Nelson, MD 06/14/16 [id number]

## 2016-06-14 NOTE — ED Notes (Signed)
Pt reports started last night with feeling sore across his lower abd. Pt reports feeling like he is not emptying his bladder. Pt reports recently (06/01/16) seen by urology and had his medications changed. Pt denies n/v/d.

## 2016-06-14 NOTE — Discharge Instructions (Signed)
Acute Urinary Retention, Male °Acute urinary retention is the temporary inability to urinate. °This is a common problem in older men. As men age their prostates become larger and block the flow of urine from the bladder. This is usually a problem that has come on gradually.  °HOME CARE INSTRUCTIONS °If you are sent home with a Foley catheter and a drainage system, you will need to discuss the best course of action with your health care provider. While the catheter is in, maintain a good intake of fluids. Keep the drainage bag emptied and lower than your catheter. This is so that contaminated urine will not flow back into your bladder, which could lead to a urinary tract infection. °There are two main types of drainage bags. One is a large bag that usually is used at night. It has a good capacity that will allow you to sleep through the night without having to empty it. The second type is called a leg bag. It has a smaller capacity, so it needs to be emptied more frequently. However, the main advantage is that it can be attached by a leg strap and can go underneath your clothing, allowing you the freedom to move about or leave your home. °Only take over-the-counter or prescription medicines for pain, discomfort, or fever as directed by your health care provider.  °SEEK MEDICAL CARE IF: °· You develop a low-grade fever. °· You experience spasms or leakage of urine with the spasms. °SEEK IMMEDIATE MEDICAL CARE IF:  °· You develop chills or fever. °· Your catheter stops draining urine. °· Your catheter falls out. °· You start to develop increased bleeding that does not respond to rest and increased fluid intake. °MAKE SURE YOU: °· Understand these instructions. °· Will watch your condition. °· Will get help right away if you are not doing well or get worse. °  °This information is not intended to replace advice given to you by your health care provider. Make sure you discuss any questions you have with your health care  provider. °  °Document Released: 03/15/2001 Document Revised: 04/23/2015 Document Reviewed: 05/18/2013 °Elsevier Interactive Patient Education ©2016 Elsevier Inc. ° ° °Urinary Tract Infection °Urinary tract infections (UTIs) can develop anywhere along your urinary tract. Your urinary tract is your body's drainage system for removing wastes and extra water. Your urinary tract includes two kidneys, two ureters, a bladder, and a urethra. Your kidneys are a pair of bean-shaped organs. Each kidney is about the size of your fist. They are located below your ribs, one on each side of your spine. °CAUSES °Infections are caused by microbes, which are microscopic organisms, including fungi, viruses, and bacteria. These organisms are so small that they can only be seen through a microscope. Bacteria are the microbes that most commonly cause UTIs. °SYMPTOMS  °Symptoms of UTIs may vary by age and gender of the patient and by the location of the infection. Symptoms in young women typically include a frequent and intense urge to urinate and a painful, burning feeling in the bladder or urethra during urination. Older women and men are more likely to be tired, shaky, and weak and have muscle aches and abdominal pain. A fever may mean the infection is in your kidneys. Other symptoms of a kidney infection include pain in your back or sides below the ribs, nausea, and vomiting. °DIAGNOSIS °To diagnose a UTI, your caregiver will ask you about your symptoms. Your caregiver will also ask you to provide a urine sample. The urine   sample will be tested for bacteria and white blood cells. White blood cells are made by your body to help fight infection. °TREATMENT  °Typically, UTIs can be treated with medication. Because most UTIs are caused by a bacterial infection, they usually can be treated with the use of antibiotics. The choice of antibiotic and length of treatment depend on your symptoms and the type of bacteria causing your  infection. °HOME CARE INSTRUCTIONS °· If you were prescribed antibiotics, take them exactly as your caregiver instructs you. Finish the medication even if you feel better after you have only taken some of the medication. °· Drink enough water and fluids to keep your urine clear or pale yellow. °· Avoid caffeine, tea, and carbonated beverages. They tend to irritate your bladder. °· Empty your bladder often. Avoid holding urine for long periods of time. °· Empty your bladder before and after sexual intercourse. °· After a bowel movement, women should cleanse from front to back. Use each tissue only once. °SEEK MEDICAL CARE IF:  °· You have back pain. °· You develop a fever. °· Your symptoms do not begin to resolve within 3 days. °SEEK IMMEDIATE MEDICAL CARE IF:  °· You have severe back pain or lower abdominal pain. °· You develop chills. °· You have nausea or vomiting. °· You have continued burning or discomfort with urination. °MAKE SURE YOU:  °· Understand these instructions. °· Will watch your condition. °· Will get help right away if you are not doing well or get worse. °  °This information is not intended to replace advice given to you by your health care provider. Make sure you discuss any questions you have with your health care provider. °  °Document Released: 09/16/2005 Document Revised: 08/28/2015 Document Reviewed: 01/15/2012 °Elsevier Interactive Patient Education ©2016 Elsevier Inc. ° °

## 2016-06-14 NOTE — ED Notes (Signed)
Thanh EDT to attach leg bag to patient.

## 2016-06-14 NOTE — ED Notes (Signed)
Post void bladder scan performed and showing 491 ml of urine in bladder.

## 2016-06-14 NOTE — ED Notes (Signed)
Bladder scan performed and showing of urine. Pt to bathroom to attempt to urinate.

## 2016-06-14 NOTE — ED Notes (Signed)
Pt urinated approx 60 mls of urine that is yellow and cloudy in appearance. Spoke with Dr Scotty CourtStafford and new orders received.

## 2016-06-15 LAB — URINE CULTURE: CULTURE: NO GROWTH

## 2016-06-22 ENCOUNTER — Ambulatory Visit: Payer: Medicare Other

## 2016-06-25 ENCOUNTER — Ambulatory Visit (INDEPENDENT_AMBULATORY_CARE_PROVIDER_SITE_OTHER): Payer: Medicare Other

## 2016-06-25 DIAGNOSIS — R339 Retention of urine, unspecified: Secondary | ICD-10-CM

## 2016-06-25 LAB — BLADDER SCAN AMB NON-IMAGING

## 2016-06-25 NOTE — Progress Notes (Signed)
Pt RTC this afternoon and PVR was >482. Foley was replaced. Simple Catheter Placement  Due to urinary retention patient is present today for a foley cath placement.  Patient was cleaned and prepped in a sterile fashion with betadine and lidocaine jelly 2% was instilled into the urethra.  A 14 FR foley catheter was inserted, urine return was noted  500ml, urine was yellow in color.  The balloon was filled with 10cc of sterile water.  A leg bag was attached for drainage. Patient was also given a night bag to take home and was given instruction on how to change from one bag to another.  Patient was given instruction on proper catheter care.  Patient tolerated well, no complications were noted   Preformed by: Rupert Stackshelsea Mariadelcarmen Corella, LPN   Additional notes: per Carollee HerterShannon pt will keep foley until f/u appt.

## 2016-06-25 NOTE — Progress Notes (Signed)
Fill and Pull Catheter Removal  Patient is present today for a catheter removal.  Patient was cleaned and prepped in a sterile fashion 400ml of sterile water/ saline was instilled into the bladder when the patient felt the urge to urinate. 3ml of water was then drained from the balloon.  A 8FR foley cath was removed from the bladder no complications were noted .  Patient as then given some time to void on their own.  Patient can void  100ml on their own after some time.  Patient tolerated well.  Preformed by: Rupert Stackshelsea Ammarie Matsuura, LPN   Follow up/ Additional notes: Pt was not able to urinate at least half of instill amount. Pt requested to leave and come back at 3:00 this afternoon. Per Carollee HerterShannon this would be ok. Reinforced with pt the importance of RTC this afternoon. Pt voiced understanding.

## 2016-06-26 ENCOUNTER — Ambulatory Visit: Payer: Medicare Other

## 2016-07-07 ENCOUNTER — Telehealth: Payer: Self-pay | Admitting: Urology

## 2016-07-07 ENCOUNTER — Ambulatory Visit (INDEPENDENT_AMBULATORY_CARE_PROVIDER_SITE_OTHER): Payer: Medicare Other | Admitting: Urology

## 2016-07-07 ENCOUNTER — Encounter: Payer: Self-pay | Admitting: Urology

## 2016-07-07 VITALS — BP 121/58 | HR 74 | Ht 64.0 in | Wt 157.1 lb

## 2016-07-07 DIAGNOSIS — R338 Other retention of urine: Principal | ICD-10-CM

## 2016-07-07 DIAGNOSIS — N528 Other male erectile dysfunction: Secondary | ICD-10-CM

## 2016-07-07 DIAGNOSIS — N400 Enlarged prostate without lower urinary tract symptoms: Secondary | ICD-10-CM | POA: Diagnosis not present

## 2016-07-07 DIAGNOSIS — N401 Enlarged prostate with lower urinary tract symptoms: Secondary | ICD-10-CM

## 2016-07-07 DIAGNOSIS — N529 Male erectile dysfunction, unspecified: Secondary | ICD-10-CM

## 2016-07-07 DIAGNOSIS — R339 Retention of urine, unspecified: Secondary | ICD-10-CM

## 2016-07-07 MED ORDER — TAMSULOSIN HCL 0.4 MG PO CAPS: 0.4000 mg | ORAL_CAPSULE | Freq: Every day | ORAL | Status: DC

## 2016-07-07 NOTE — Progress Notes (Signed)
11:52 AM   Dorothyann PengEarl W Ernest MallickIsley February 01, 1946 [id number]  Referring provider: Oswaldo ConroyAbby Daneele Bender, MD [address] [place], [place] [id number]  Chief Complaint  Patient presents with  . Benign Prostatic Hypertrophy    1 month follow up  . Erectile Dysfunction    HPI: Patient is a 70 year old African American male who presents today for follow-up after being seen in the emergency room at [facility] for urinary retention.   History of urinary retention Patient was found to be in urinary retention after suffering a TIA in December 2015. Patient was initiated on finasteride and Flomax.   He did undergo cystoscopy in June 2015 and was found to have an enlarged prostate and a bladder diverticulum with hypospadias.  He was last seen in our office on 06/01/2016 and was found to have a large PVR. He was not wanting to have a Foley catheter placed at that time and he did not want to learn CIC. His Flomax was changed to Cialis as he was also suffering from erectile dysfunction. He continued the finasteride. He went into urinary retention before his follow-up appointment in 1 month.  Foley was placed in the ED.  He presented to our office for a fill and spill.  He was unable to empty his bladder and the Foley was replaced.  Erectile dysfunction His SHIM score is 12, which is moderate ED.   He has been having difficulty with erections for the last several years.   His major complaint is achieving an erection.  His libido is preserved.   His risk factors for ED are age, BPH, HTN, and DM.  He denies any painful erections or curvatures with his erections.   He has tried Viagra in the past unsatisfactory results.  Cialis was no better.     SHIM      06/01/16 1524 07/07/16 1144     SHIM: Over the last 6 months:   How do you rate your confidence that you could get and keep an erection? Low Moderate    When you had erections with sexual stimulation, how often were your  erections hard enough for penetration (entering your partner)? A Few Times (much less than half the time) A Few Times (much less than half the time)    During sexual intercourse, how often were you able to maintain your erection after you had penetrated (entered) your partner? Very Difficult Very Difficult    During sexual intercourse, how difficult was it to maintain your erection to completion of intercourse? Extremely Difficult Difficult    When you attempted sexual intercourse, how often was it satisfactory for you? Extremely Difficult Very Difficult    SHIM Total Score   SHIM 8 12       Score: 1-7 Severe ED 8-11 Moderate ED 12-16 Mild-Moderate ED 17-21 Mild ED 22-25 No ED   BPH WITH LUTS His IPSS score today is 10, which is moderate lower urinary tract symptomatology. He is mostly satisfied with his quality life due to his urinary symptoms.  He denies any dysuria, hematuria or suprapubic pain.  He is currently taking Cialis 5 mg daily and finasteride 5 mg daily.  He also denies any recent fevers, chills, nausea or vomiting.  He has a family history of prostate cancer in his brother.        IPSS      06/01/16 1500 07/07/16 1100     International Prostate Symptom Score  How often have you had the sensation of not emptying your bladder? Not at All Less than half the time    How often have you had to urinate less than every two hours? Not at All About half the time    How often have you found you stopped and started again several times when you urinated? Not at All Less than 1 in 5 times    How often have you found it difficult to postpone urination? Not at All Less than half the time    How often have you had a weak urinary stream? Not at All Less than half the time    How often have you had to strain to start urination? Not at All Not at All    How many times did you typically get up at night to urinate? None None    Total IPSS Score 0 10    Quality of Life due to urinary  symptoms   If you were to spend the rest of your life with your urinary condition just the way it is now how would you feel about that? Mostly Satisfied Unhappy       Score:  1-7 Mild 8-19 Moderate 20-35 Severe     PMH: Past Medical History  Diagnosis Date  . Diabetes mellitus, type 2 (HCC)   . HTN (hypertension)   . Bilateral kidney stones   . Hematuria     gross  . Urinary retention   . BPH (benign prostatic hypertrophy)     Surgical History: Past Surgical History  Procedure Laterality Date  . None      Home Medications:    Medication List       This list is accurate as of: 07/07/16 11:52 AM.  Always use your most recent med list.               aspirin 81 MG tablet  Take 81 mg by mouth daily.     cephALEXin 500 MG capsule  Commonly known as:  KEFLEX  Take 1 capsule (500 mg total) by mouth 2 (two) times daily.     finasteride 5 MG tablet  Commonly known as:  PROSCAR  Take 1 tablet (5 mg total) by mouth daily.     glipiZIDE 5 MG tablet  Commonly known as:  GLUCOTROL     hydrochlorothiazide 25 MG tablet  Commonly known as:  HYDRODIURIL     lisinopril 10 MG tablet  Commonly known as:  PRINIVIL,ZESTRIL     simvastatin 20 MG tablet  Commonly known as:  ZOCOR     tadalafil 5 MG tablet  Commonly known as:  CIALIS  Take 5 mg by mouth daily as needed for erectile dysfunction.     tamsulosin 0.4 MG Caps capsule  Commonly known as:  FLOMAX  Take 1 capsule (0.4 mg total) by mouth daily.        Allergies:  Allergies  Allergen Reactions  . Codeine     Family History: Family History  Problem Relation Age of Onset  . Colon cancer Brother   . Cancer    . Prostate cancer Brother   . Lung cancer Father     Social History:  reports that he has quit smoking. He does not have any smokeless tobacco history on file. He reports that he does not drink alcohol. His drug history is not on file.  ROS: UROLOGY Frequent Urination?: No Hard to postpone  urination?: No Burning/pain with urination?: No Get  up at night to urinate?: No Leakage of urine?: No Urine stream starts and stops?: No Trouble starting stream?: No Do you have to strain to urinate?: No Blood in urine?: No Urinary tract infection?: No Sexually transmitted disease?: No Injury to kidneys or bladder?: No Painful intercourse?: No Weak stream?: No Erection problems?: No Penile pain?: No  Gastrointestinal Nausea?: No Vomiting?: No Indigestion/heartburn?: No Diarrhea?: No Constipation?: No  Constitutional Fever: No Night sweats?: No Weight loss?: No Fatigue?: No  Skin Skin rash/lesions?: No Itching?: No  Eyes Blurred vision?: No Double vision?: No  Ears/Nose/Throat Sore throat?: No Sinus problems?: No  Hematologic/Lymphatic Swollen glands?: No Easy bruising?: No  Cardiovascular Leg swelling?: No Chest pain?: No  Respiratory Cough?: No Shortness of breath?: No  Endocrine Excessive thirst?: No  Musculoskeletal Back pain?: No Joint pain?: No  Neurological Headaches?: No Dizziness?: No  Psychologic Depression?: No Anxiety?: No  Physical Exam: BP 121/58 mmHg  Pulse 74  Ht 5\' 4"  (1.626 m)  Wt 157 lb 1.6 oz (71.26 kg)  BMI 26.95 kg/m2  Constitutional: Well nourished. Alert and oriented, No acute distress. HEENT: [place] AT, moist mucus membranes. Trachea midline, no masses. Cardiovascular: No clubbing, cyanosis, or edema. Respiratory: Normal respiratory effort, no increased work of breathing. Skin: No rashes, bruises or suspicious lesions. Lymph: No cervical or inguinal adenopathy. Neurologic: Grossly intact, no focal deficits, moving all 4 extremities. Psychiatric: Normal mood and affect.  Laboratory Data: Lab Results  Component Value Date   WBC 9.8 12/02/2014   HGB 10.7* 12/02/2014   HCT 31.9* 12/02/2014   MCV 86 12/02/2014   PLT 206 12/02/2014    Lab Results  Component Value Date   CREATININE 1.39* 06/14/2016    Lab  Results  Component Value Date   PSA 3.1 12/01/2014     Lab Results  Component Value Date   AST 21 06/14/2016   Lab Results  Component Value Date   ALT 16* 06/14/2016      Assessment & Plan:    1. Urinary retention:   Patient had another episode of urinary retention two weeks ago.   A cystoscopy in 2015 noted an enlarged prostate with bladder diverticulum.  He states he feels he was urinating better on tamsulosin versus the Cialis.  He is not wanting to undergo another cystoscopic examination at this time.  He would like to try the tamsulosin again to see if this would allow him better bladder emptying.   He will continue the finasteride and initiate the tamsulosin. He will return in 1 month for a voiding trial in the morning.  2. Erectile dysfunction:   SHIM score is 12.   He did not find the Cialis effective. He now has an indwelling Foley. We will readdress this issue at a later appointment once his urinary issues have been improved.  3. BPH (benign prostatic hyperplasia) with urinary retention:   IPSS score is 0/2.  I explained to the patient the ramifications of having an large postvoid residual, such as urinary tract infections, bladder calculi and renal failure. I will be checking a BUN and creatinine at today's visit. He will continue the finasteride 5 mg daily and he will restart the tamsulosin 0.4 mg daily to see if we can reduce his residuals. He will follow-up for a voiding trial in month.    Return in about 1 month (around 08/07/2016) for voiding trial in the am.  These notes generated with voice recognition software. I apologize for typographical errors.  Raphel Stickles  [place], [facility] [address] [place], [place] [address] [phone]

## 2016-07-07 NOTE — Telephone Encounter (Signed)
Pt wants to know if he should continue his Finasteride with prescription given today.  Please call pt [phone]

## 2016-07-14 ENCOUNTER — Telehealth: Payer: Self-pay

## 2016-07-14 NOTE — Telephone Encounter (Signed)
Pt called stating over night last night the cath bag quit draining. Pt stated that he was in an extreme amount of pain but he was able to get the bag to start draining again. Pt wanted to come in to the office to have foley removed. Reinforced with pt the need of the foley and the need to keep it. Pt was hesitant but voiced understanding.

## 2016-07-26 ENCOUNTER — Emergency Department
Admission: EM | Admit: 2016-07-26 | Discharge: 2016-07-26 | Disposition: A | Discharge status: Home / Self Care | Payer: Medicare Other | Attending: Emergency Medicine | Admitting: Emergency Medicine

## 2016-07-26 ENCOUNTER — Encounter: Payer: Self-pay | Admitting: Medical Oncology

## 2016-07-26 DIAGNOSIS — I10 Essential (primary) hypertension: Secondary | ICD-10-CM | POA: Diagnosis not present

## 2016-07-26 DIAGNOSIS — Y732 Prosthetic and other implants, materials and accessory gastroenterology and urology devices associated with adverse incidents: Secondary | ICD-10-CM | POA: Diagnosis not present

## 2016-07-26 DIAGNOSIS — Z7982 Long term (current) use of aspirin: Secondary | ICD-10-CM | POA: Insufficient documentation

## 2016-07-26 DIAGNOSIS — T83038A Leakage of other indwelling urethral catheter, initial encounter: Secondary | ICD-10-CM | POA: Diagnosis present

## 2016-07-26 DIAGNOSIS — N400 Enlarged prostate without lower urinary tract symptoms: Secondary | ICD-10-CM | POA: Insufficient documentation

## 2016-07-26 DIAGNOSIS — R339 Retention of urine, unspecified: Secondary | ICD-10-CM | POA: Insufficient documentation

## 2016-07-26 DIAGNOSIS — Z87891 Personal history of nicotine dependence: Secondary | ICD-10-CM | POA: Diagnosis not present

## 2016-07-26 DIAGNOSIS — E119 Type 2 diabetes mellitus without complications: Secondary | ICD-10-CM | POA: Diagnosis not present

## 2016-07-26 NOTE — ED Triage Notes (Signed)
Pt reports that he has a leg bag that has been leaking. Pt reports he wants to have his foley removed.

## 2016-07-26 NOTE — ED Notes (Signed)
See triage note  States he has had foley cath for approx 1 month  And noticed that the bag is leaking  Wants to have foley cath removed  Next urology approx is in 1 1/2 weeks

## 2016-07-26 NOTE — Discharge Instructions (Signed)
Continue to monitor Foley catheter and bag for leakage.

## 2016-07-26 NOTE — ED Provider Notes (Signed)
[facility] Emergency Department Provider Note   ____________________________________________   First MD Initiated Contact with Patient 07/26/16 1141     (approximate)  I have reviewed the triage vital signs and the nursing notes.   HISTORY  Chief Complaint Other (leg bag leaking)    HPI Vincent Bautista is a 70 y.o. male patient today complaining that his Foley bag is leaking. Patient's had a Foley catheter in place for approximately one month. This is secondary to retention. Patient scheduled to see urology and one half weeks.Patient denies any pain with this complaint. Patient denies any fever. Patient asked to have the Foley removed or at least change the bag.   Past Medical History:  Diagnosis Date  . Bilateral kidney stones   . BPH (benign prostatic hypertrophy)   . Diabetes mellitus, type 2 (HCC)   . Hematuria    gross  . HTN (hypertension)   . Urinary retention     There are no active problems to display for this patient.   Past Surgical History:  Procedure Laterality Date  . none      Prior to Admission medications   Medication Sig Start Date End Date Taking? Authorizing Provider  aspirin 81 MG tablet Take 81 mg by mouth daily.    Historical Provider, MD  cephALEXin (KEFLEX) 500 MG capsule Take 1 capsule (500 mg total) by mouth 2 (two) times daily. Patient not taking: Reported on 07/07/2016 06/14/16   Sharman Cheek, MD  finasteride (PROSCAR) 5 MG tablet Take 1 tablet (5 mg total) by mouth daily. 06/01/16   Harle Battiest, PA-C  glipiZIDE (GLUCOTROL) 5 MG tablet  04/16/16   Historical Provider, MD  hydrochlorothiazide (HYDRODIURIL) 25 MG tablet  04/05/16   Historical Provider, MD  lisinopril (PRINIVIL,ZESTRIL) 10 MG tablet  04/16/16   Historical Provider, MD  simvastatin (ZOCOR) 20 MG tablet  04/05/16   Historical Provider, MD  tadalafil (CIALIS) 5 MG tablet Take 5 mg by mouth daily as needed for erectile dysfunction.    Historical  Provider, MD  tamsulosin (FLOMAX) 0.4 MG CAPS capsule Take 1 capsule (0.4 mg total) by mouth daily. 07/07/16   Harle Battiest, PA-C    Allergies Codeine  Family History  Problem Relation Age of Onset  . Colon cancer Brother   . Cancer    . Prostate cancer Brother   . Lung cancer Father     Social History Social History  Substance Use Topics  . Smoking status: Former Games developer  . Smokeless tobacco: Not on file  . Alcohol use No    Review of Systems Constitutional: No fever/chills Eyes: No visual changes. ENT: No sore throat. Cardiovascular: Denies chest pain. Respiratory: Denies shortness of breath. Gastrointestinal: No abdominal pain.  No nausea, no vomiting.  No diarrhea.  No constipation. Genitourinary: Urinary retention Musculoskeletal: Negative for back pain. Skin: Negative for rash. Neurological: Negative for headaches, focal weakness or numbness. Endocrine:Hypertension and diabetes ____________________________________________   PHYSICAL EXAM:  VITAL SIGNS: ED Triage Vitals [07/26/16 1106]  Enc Vitals Group     BP 111/65     Pulse Rate 72     Resp 18     Temp 97.8 F (36.6 C)     Temp Source Oral     SpO2 97 %     Weight 157 lb (71.2 kg)     Height  (1.626 m)     Head Circumference      Peak Flow  Pain Score      Pain Loc      Pain Edu?      Excl. in GC?     Constitutional: Alert and oriented. Well appearing and in no acute distress. Eyes: Conjunctivae are normal. PERRL. EOMI. Head: Atraumatic. Nose: No congestion/rhinnorhea. Mouth/Throat: Mucous membranes are moist.  Oropharynx non-erythematous. Neck: No stridor.  No cervical spine tenderness to palpation. Hematological/Lymphatic/Immunilogical: No cervical lymphadenopathy. Cardiovascular: Normal rate, regular rhythm. Grossly normal heart sounds.  Good peripheral circulation. Respiratory: Normal respiratory effort.  No retractions. Lungs CTAB. Gastrointestinal: Soft and nontender. No  distention. No abdominal bruits. No CVA tenderness. Musculoskeletal: No lower extremity tenderness nor edema.  No joint effusions. Neurologic:  Normal speech and language. No gross focal neurologic deficits are appreciated. No gait instability. Skin:  Skin is warm, dry and intact. No rash noted. Psychiatric: Mood and affect are normal. Speech and behavior are normal.  ____________________________________________   LABS (all labs ordered are listed, but only abnormal results are displayed)  Labs Reviewed - No data to display ____________________________________________  EKG   ____________________________________________  RADIOLOGY   ____________________________________________   PROCEDURES  Procedure(s) performed: None  Procedures  Critical Care performed: No  ____________________________________________   INITIAL IMPRESSION / ASSESSMENT AND PLAN / ED COURSE  Pertinent labs & imaging results that were available during my care of the patient were reviewed by me and considered in my medical decision making (see chart for details).  Urinary retention. Secondary to patient complaining of ba leaking a new bag will be supplied. Patient advised to follow-up with scheduled urology appointment or return back to the ER if his condition worsens.  Clinical Course     ____________________________________________   FINAL CLINICAL IMPRESSION(S) / ED DIAGNOSES  Final diagnoses:  Urinary retention  BPH (benign prostatic hyperplasia)      NEW MEDICATIONS STARTED DURING THIS VISIT:  New Prescriptions   No medications on file     Note:  This document was prepared using Dragon voice recognition software and may include unintentional dictation errors.    Joni Reiningonald K Maybell Misenheimer, PA-C 07/26/16 1152    Jene Everyobert Kinner, MD 07/26/16 [phone]

## 2016-08-07 ENCOUNTER — Ambulatory Visit (INDEPENDENT_AMBULATORY_CARE_PROVIDER_SITE_OTHER): Payer: Medicare Other | Admitting: Urology

## 2016-08-07 ENCOUNTER — Encounter: Payer: Self-pay | Admitting: Urology

## 2016-08-07 VITALS — BP 129/78 | HR 66 | Ht 64.0 in | Wt 153.8 lb

## 2016-08-07 DIAGNOSIS — R7989 Other specified abnormal findings of blood chemistry: Secondary | ICD-10-CM

## 2016-08-07 DIAGNOSIS — R339 Retention of urine, unspecified: Secondary | ICD-10-CM | POA: Diagnosis not present

## 2016-08-07 DIAGNOSIS — N400 Enlarged prostate without lower urinary tract symptoms: Secondary | ICD-10-CM

## 2016-08-07 DIAGNOSIS — R748 Abnormal levels of other serum enzymes: Secondary | ICD-10-CM

## 2016-08-07 DIAGNOSIS — R338 Other retention of urine: Secondary | ICD-10-CM

## 2016-08-07 DIAGNOSIS — N401 Enlarged prostate with lower urinary tract symptoms: Secondary | ICD-10-CM

## 2016-08-07 NOTE — Progress Notes (Signed)
Catheter Removal  Patient is present today for a catheter removal.  9ml of water was drained from the balloon. A 10FR foley cath was removed from the bladder no complications were noted . Patient tolerated well.  Preformed by: Dallas Schimkeamona Williams CMA  Follow up/ Additional notes: Come back to office at 3:00pm today if patient can not void.

## 2016-08-07 NOTE — Progress Notes (Signed)
10:05 AM   Vincent Bautista 12/13/1946 [id number]  Referring provider: Oswaldo ConroyAbby Daneele Bender, MD [address] [place], [place] [id number]  Chief Complaint  Patient presents with  . Urinary Retention    Voiding trial    HPI: Patient is a 70 year old African American male who presents today for a voiding trial.     History of urinary retention Patient was found to be in urinary retention after suffering a TIA in December 2015. Patient was initiated on finasteride and Flomax.   He did undergo cystoscopy in June 2015 and was found to have an enlarged prostate and a bladder diverticulum with hypospadias.  He continues to have large residuals and has failed two voiding trials.  He does not have a good understanding of his situation as he continually asks for fluids pills to help him urinate.  Foley is removed today.    Erectile dysfunction His SHIM score is 12, which is moderate ED.   He has been having difficulty with erections for the last several years.   His major complaint is achieving an erection.  His libido is preserved.   His risk factors for ED are age, BPH, HTN, and DM.  He denies any painful erections or curvatures with his erections.   He has tried Viagra in the past unsatisfactory results.  Cialis was no better.     SHIM    Row Name 07/07/16 1144         SHIM: Over the last 6 months:   How do you rate your confidence that you could get and keep an erection? Moderate     When you had erections with sexual stimulation, how often were your erections hard enough for penetration (entering your partner)? A Few Times (much less than half the time)     During sexual intercourse, how often were you able to maintain your erection after you had penetrated (entered) your partner? Very Difficult     During sexual intercourse, how difficult was it to maintain your erection to completion of intercourse? Difficult     When you attempted sexual intercourse, how often was it  satisfactory for you? Very Difficult       SHIM Total Score   SHIM 12        Score: 1-7 Severe ED 8-11 Moderate ED 12-16 Mild-Moderate ED 17-21 Mild ED 22-25 No ED   BPH WITH LUTS His most recent IPSS score was 10, which is moderate lower urinary tract symptomatology. He is mostly satisfied with his quality life due to his urinary symptoms.  He denies any dysuria, hematuria or suprapubic pain.  He is currently taking tamsulosin 0.4 mg daily and finasteride 5 mg daily.  He also denies any recent fevers, chills, nausea or vomiting.  He has a family history of prostate cancer in his brother.        IPSS    Row Name 07/07/16 1100         International Prostate Symptom Score   How often have you had the sensation of not emptying your bladder? Less than half the time     How often have you had to urinate less than every two hours? About half the time     How often have you found you stopped and started again several times when you urinated? Less than 1 in 5 times     How often have you found it difficult to postpone urination? Less than half the  time     How often have you had a weak urinary stream? Less than half the time     How often have you had to strain to start urination? Not at All     How many times did you typically get up at night to urinate? None     Total IPSS Score 10       Quality of Life due to urinary symptoms   If you were to spend the rest of your life with your urinary condition just the way it is now how would you feel about that? Unhappy        Score:  1-7 Mild 8-19 Moderate 20-35 Severe     PMH: Past Medical History:  Diagnosis Date  . Bilateral kidney stones   . BPH (benign prostatic hypertrophy)   . Diabetes mellitus, type 2 (HCC)   . Hematuria    gross  . HTN (hypertension)   . Urinary retention     Surgical History: Past Surgical History:  Procedure Laterality Date  . none      Home Medications:    Medication List         Accurate as of 08/07/16 10:05 AM. Always use your most recent med list.          aspirin 81 MG tablet Take 81 mg by mouth daily.   cephALEXin 500 MG capsule Commonly known as:  KEFLEX Take 1 capsule (500 mg total) by mouth 2 (two) times daily.   finasteride 5 MG tablet Commonly known as:  PROSCAR Take 1 tablet (5 mg total) by mouth daily.   glipiZIDE 5 MG tablet Commonly known as:  GLUCOTROL   hydrochlorothiazide 25 MG tablet Commonly known as:  HYDRODIURIL   lisinopril 10 MG tablet Commonly known as:  PRINIVIL,ZESTRIL   simvastatin 20 MG tablet Commonly known as:  ZOCOR   tadalafil 5 MG tablet Commonly known as:  CIALIS Take 5 mg by mouth daily as needed for erectile dysfunction.   tamsulosin 0.4 MG Caps capsule Commonly known as:  FLOMAX Take 1 capsule (0.4 mg total) by mouth daily.       Allergies:  Allergies  Allergen Reactions  . Codeine     Family History: Family History  Problem Relation Age of Onset  . Colon cancer Brother   . Cancer    . Prostate cancer Brother   . Lung cancer Father   . Kidney disease Neg Hx     Social History:  reports that he has quit smoking. He has never used smokeless tobacco. He reports that he does not drink alcohol or use drugs.  ROS: UROLOGY Frequent Urination?: No Hard to postpone urination?: No Burning/pain with urination?: No Get up at night to urinate?: No Leakage of urine?: No Urine stream starts and stops?: No Trouble starting stream?: No Do you have to strain to urinate?: No Blood in urine?: No Urinary tract infection?: No Sexually transmitted disease?: No Injury to kidneys or bladder?: No Painful intercourse?: No Weak stream?: No Erection problems?: No Penile pain?: No  Gastrointestinal Nausea?: No Vomiting?: No Indigestion/heartburn?: No Diarrhea?: No Constipation?: No  Constitutional Fever: No Night sweats?: No Weight loss?: No Fatigue?: No  Skin Skin rash/lesions?: No Itching?:  No  Eyes Blurred vision?: No Double vision?: No  Ears/Nose/Throat Sore throat?: No Sinus problems?: No  Hematologic/Lymphatic Swollen glands?: No Easy bruising?: No  Cardiovascular Leg swelling?: No Chest pain?: No  Respiratory Cough?: No Shortness of breath?: No  Endocrine  Excessive thirst?: No  Musculoskeletal Back pain?: No Joint pain?: No  Neurological Headaches?: No Dizziness?: No  Psychologic Depression?: No Anxiety?: No  Physical Exam: BP 129/78   Pulse 66   Ht 5\' 4"  (1.626 m)   Wt 153 lb 12.8 oz (69.8 kg)   BMI 26.40 kg/m   Constitutional: Well nourished. Alert and oriented, No acute distress. HEENT: [place] AT, moist mucus membranes. Trachea midline, no masses. Cardiovascular: No clubbing, cyanosis, or edema. Respiratory: Normal respiratory effort, no increased work of breathing. Skin: No rashes, bruises or suspicious lesions. Lymph: No cervical or inguinal adenopathy. Neurologic: Grossly intact, no focal deficits, moving all 4 extremities. Psychiatric: Normal mood and affect.  Laboratory Data: Lab Results  Component Value Date   WBC 9.8 12/02/2014   HGB 10.7 (L) 12/02/2014   HCT 31.9 (L) 12/02/2014   MCV 86 12/02/2014   PLT 206 12/02/2014    Lab Results  Component Value Date   CREATININE 1.39 (H) 06/14/2016    Lab Results  Component Value Date   PSA 3.1 12/01/2014     Lab Results  Component Value Date   AST 21 06/14/2016   Lab Results  Component Value Date   ALT 16 (L) 06/14/2016   Assessment & Plan:    1. Urinary retention  - voiding trial today, instructed to RTC by this afternoon if unable to void   - explained to the patient that if he failed this voiding trial, he will need to pursue other options (learn CIC, indwelling foley, SPT and/or surgery)   - refused to learn CIC  - if voiding trial is successful, he will RTC in one month for IPSS score and PVR   2. Erectile dysfunction:   We will readdress this issue at a  later appointment once his urinary issues have been improved.  3. BPH (benign prostatic hyperplasia) with urinary retention:   IPSS score is 0/2.  I explained to the patient the ramifications of having an large postvoid residual, such as urinary tract infections, bladder calculi and renal failure. I will be checking a BUN and creatinine at today's visit. He will continue the finasteride 5 mg daily and he will continue the tamsulosin 0.4 mg daily to see if we can reduce his residuals. He will follow-up in one month if voiding trial is successful.   4. Elevated serum creatinine  - 1.39 one month ago  - recheck today    Return in about 1 month (around 09/07/2016) for IPSS and PVR.  These notes generated with voice recognition software. I apologize for typographical errors.  Michiel CowboySHANNON Terrilyn Tyner, PA-C  [facility] [address], [address] [place], [place] [address] [phone]

## 2016-08-08 LAB — BUN+CREAT
BUN/Creatinine Ratio: 19 (ref 10–24)
BUN: 22 mg/dL (ref 8–27)
CREATININE: 1.17 mg/dL (ref 0.76–1.27)
GFR, EST AFRICAN AMERICAN: 74 mL/min/{1.73_m2} (ref >59)
GFR, EST NON AFRICAN AMERICAN: 64 mL/min/{1.73_m2} (ref >59)

## 2016-09-01 ENCOUNTER — Encounter: Payer: Self-pay | Admitting: Urology

## 2016-09-01 ENCOUNTER — Ambulatory Visit (INDEPENDENT_AMBULATORY_CARE_PROVIDER_SITE_OTHER): Payer: Medicare Other | Admitting: Urology

## 2016-09-01 VITALS — BP 125/74 | HR 80 | Ht 64.0 in | Wt 156.9 lb

## 2016-09-01 DIAGNOSIS — R339 Retention of urine, unspecified: Secondary | ICD-10-CM | POA: Diagnosis not present

## 2016-09-01 DIAGNOSIS — N400 Enlarged prostate without lower urinary tract symptoms: Secondary | ICD-10-CM

## 2016-09-01 DIAGNOSIS — N401 Enlarged prostate with lower urinary tract symptoms: Secondary | ICD-10-CM

## 2016-09-01 DIAGNOSIS — R338 Other retention of urine: Principal | ICD-10-CM

## 2016-09-01 LAB — BLADDER SCAN AMB NON-IMAGING: SCAN RESULT: 378

## 2016-09-01 NOTE — Progress Notes (Signed)
8:54 AM   Vincent Bautista 1946/05/02 [id number]  Referring provider: Oswaldo ConroyAbby Daneele Bender, MD [address] [place], [place] [phone]  Chief Complaint  Patient presents with  . Benign Prostatic Hypertrophy    1 month follow up  . Urinary Retention    HPI: Patient is a 70 year old African American male who presents today for IPSS score and a recheck on his PVR.       History of urinary retention Patient was found to be in urinary retention after suffering a TIA in December 2015. Patient was initiated on finasteride and Flomax.   He did undergo cystoscopy in June 2015 and was found to have an enlarged prostate and a bladder diverticulum with hypospadias.  He continues to have large residuals and has failed two voiding trials.  He does not have a good understanding of his situation as he continually asks for fluids pills to help him urinate.  Foley one month ago.  He states he feels like he is emptying.  PVR today is 378 mL.    Erectile dysfunction His SHIM score is 12, which is moderate ED.   He has been having difficulty with erections for the last several years.   His major complaint is achieving an erection.  His libido is preserved.   His risk factors for ED are age, BPH, HTN, and DM.  He denies any painful erections or curvatures with his erections.   He has tried Viagra in the past unsatisfactory results.  Cialis was no better.     SHIM    Row Name 07/07/16 1144         SHIM: Over the last 6 months:   How do you rate your confidence that you could get and keep an erection? Moderate     When you had erections with sexual stimulation, how often were your erections hard enough for penetration (entering your partner)? A Few Times (much less than half the time)     During sexual intercourse, how often were you able to maintain your erection after you had penetrated (entered) your partner? Very Difficult     During sexual intercourse, how difficult was it to maintain  your erection to completion of intercourse? Difficult     When you attempted sexual intercourse, how often was it satisfactory for you? Very Difficult       SHIM Total Score   SHIM 12        Score: 1-7 Severe ED 8-11 Moderate ED 12-16 Mild-Moderate ED 17-21 Mild ED 22-25 No ED   BPH WITH LUTS His most recent IPSS score was 1, which is mild lower urinary tract symptomatology. He is mostly satisfied with his quality life due to his urinary symptoms.   His previous IPSS was 10/2.   He denies any dysuria, hematuria or suprapubic pain.  He is currently taking tamsulosin 0.4 mg daily and finasteride 5 mg daily.  He also denies any recent fevers, chills, nausea or vomiting.  He has a family history of prostate cancer in his brother.        IPSS    Row Name 07/07/16 1100 09/01/16 0800       International Prostate Symptom Score   How often have you had the sensation of not emptying your bladder? Less than half the time Not at All    How often have you had to urinate less than every two hours? About half the time Not at All  How often have you found you stopped and started again several times when you urinated? Less than 1 in 5 times Not at All    How often have you found it difficult to postpone urination? Less than half the time Not at All    How often have you had a weak urinary stream? Less than half the time Less than 1 in 5 times    How often have you had to strain to start urination? Not at All Not at All    How many times did you typically get up at night to urinate? None None    Total IPSS Score 10 1      Quality of Life due to urinary symptoms   If you were to spend the rest of your life with your urinary condition just the way it is now how would you feel about that? Unhappy Mostly Satisfied       Score:  1-7 Mild 8-19 Moderate 20-35 Severe     PMH: Past Medical History:  Diagnosis Date  . Bilateral kidney stones   . BPH (benign prostatic hypertrophy)   .  Diabetes mellitus, type 2 (HCC)   . Hematuria    gross  . HTN (hypertension)   . Urinary retention     Surgical History: Past Surgical History:  Procedure Laterality Date  . none      Home Medications:    Medication List       Accurate as of 09/01/16  8:54 AM. Always use your most recent med list.          aspirin 81 MG tablet Take 81 mg by mouth daily.   cephALEXin 500 MG capsule Commonly known as:  KEFLEX Take 1 capsule (500 mg total) by mouth 2 (two) times daily.   finasteride 5 MG tablet Commonly known as:  PROSCAR Take 1 tablet (5 mg total) by mouth daily.   glipiZIDE 5 MG tablet Commonly known as:  GLUCOTROL   hydrochlorothiazide 25 MG tablet Commonly known as:  HYDRODIURIL   lisinopril 10 MG tablet Commonly known as:  PRINIVIL,ZESTRIL   simvastatin 20 MG tablet Commonly known as:  ZOCOR   tadalafil 5 MG tablet Commonly known as:  CIALIS Take 5 mg by mouth daily as needed for erectile dysfunction.   tamsulosin 0.4 MG Caps capsule Commonly known as:  FLOMAX Take 1 capsule (0.4 mg total) by mouth daily.       Allergies:  Allergies  Allergen Reactions  . Codeine     Family History: Family History  Problem Relation Age of Onset  . Colon cancer Brother   . Cancer    . Prostate cancer Brother   . Lung cancer Father   . Kidney disease Neg Hx     Social History:  reports that he has quit smoking. He has never used smokeless tobacco. He reports that he does not drink alcohol or use drugs.  ROS: UROLOGY Frequent Urination?: No Hard to postpone urination?: No Burning/pain with urination?: No Get up at night to urinate?: No Leakage of urine?: No Urine stream starts and stops?: No Trouble starting stream?: No Do you have to strain to urinate?: No Blood in urine?: No Urinary tract infection?: No Sexually transmitted disease?: No Injury to kidneys or bladder?: No Painful intercourse?: No Weak stream?: No Erection problems?: No Penile  pain?: No  Gastrointestinal Nausea?: No Vomiting?: No Indigestion/heartburn?: No Diarrhea?: No Constipation?: No  Constitutional Fever: No Night sweats?: No Weight loss?: No  Fatigue?: No  Skin Skin rash/lesions?: No Itching?: No  Eyes Blurred vision?: No Double vision?: No  Ears/Nose/Throat Sore throat?: No Sinus problems?: No  Hematologic/Lymphatic Swollen glands?: No Easy bruising?: No  Cardiovascular Leg swelling?: No Chest pain?: No  Respiratory Cough?: No Shortness of breath?: No  Endocrine Excessive thirst?: No  Musculoskeletal Back pain?: No Joint pain?: No  Neurological Headaches?: No Dizziness?: No  Psychologic Depression?: No Anxiety?: No  Physical Exam: BP 125/74   Pulse 80   Ht 5\' 4"  (1.626 m)   Wt 156 lb 14.4 oz (71.2 kg)   BMI 26.93 kg/m   Constitutional: Well nourished. Alert and oriented, No acute distress. HEENT: [place] AT, moist mucus membranes. Trachea midline, no masses. Cardiovascular: No clubbing, cyanosis, or edema. Respiratory: Normal respiratory effort, no increased work of breathing. Skin: No rashes, bruises or suspicious lesions. Lymph: No cervical or inguinal adenopathy. Neurologic: Grossly intact, no focal deficits, moving all 4 extremities. Psychiatric: Normal mood and affect.  Laboratory Data: Lab Results  Component Value Date   WBC 9.8 12/02/2014   HGB 10.7 (L) 12/02/2014   HCT 31.9 (L) 12/02/2014   MCV 86 12/02/2014   PLT 206 12/02/2014    Lab Results  Component Value Date   CREATININE 1.17 08/07/2016    Lab Results  Component Value Date   PSA 3.1 12/01/2014   PSA   0.2 ng/mL on 06/01/2016  Lab Results  Component Value Date   AST 21 06/14/2016   Lab Results  Component Value Date   ALT 16 (L) 06/14/2016   Assessment & Plan:    1. Urinary retention  - explained to the patient that if he failed this voiding trial, he will need to pursue other options (learn CIC, indwelling foley, SPT  and/or surgery)   - continues to refuse to learn CIC  - he will RTC in 3 months for IPSS score and PVR   2. Erectile dysfunction:   We will readdress this issue at a later appointment once his urinary issues have been improved.  3. BPH (benign prostatic hyperplasia) with urinary retention:   IPSS score is 1/2.  I reminded the patient the ramifications of having an large postvoid residual, such as urinary tract infections, bladder calculi and renal failure.  He will continue the finasteride 5 mg daily and he will continue the tamsulosin 0.4 mg daily to see if we can reduce his residuals.     - Discussed treatment options since he is on maximum medical therapy and his residuals are still elevated  - Discussed that the function of his bladder may be compromised, would like to pursue UDS before undergoing a surgical procedure for a clearer picture  - Patient would like to have discussions with family prior to going forward with any more tests or procedures  - RTC in 3 months for IPSS and PVR  I spent 25 minutes with the patient discussing his condition, sequelae and treatment options.  Greater than 50% was spent in counseling & coordination of care with the patient.     Return in about 3 months (around 12/01/2016) for IPSS, PVR and exam.  These notes generated with voice recognition software. I apologize for typographical errors.  Michiel Cowboy, PA-C  [facility] [address], [address] [place], [place] [address] [phone]

## 2016-12-01 ENCOUNTER — Ambulatory Visit: Payer: Medicare Other | Admitting: Urology

## 2016-12-12 ENCOUNTER — Emergency Department
Admission: EM | Admit: 2016-12-12 | Discharge: 2016-12-12 | Disposition: A | Discharge status: Home / Self Care | Payer: Medicare Other | Attending: Emergency Medicine | Admitting: Emergency Medicine

## 2016-12-12 ENCOUNTER — Encounter: Payer: Self-pay | Admitting: Emergency Medicine

## 2016-12-12 DIAGNOSIS — Y999 Unspecified external cause status: Secondary | ICD-10-CM | POA: Diagnosis not present

## 2016-12-12 DIAGNOSIS — E119 Type 2 diabetes mellitus without complications: Secondary | ICD-10-CM | POA: Insufficient documentation

## 2016-12-12 DIAGNOSIS — N390 Urinary tract infection, site not specified: Secondary | ICD-10-CM | POA: Insufficient documentation

## 2016-12-12 DIAGNOSIS — S3992XA Unspecified injury of lower back, initial encounter: Secondary | ICD-10-CM | POA: Diagnosis present

## 2016-12-12 DIAGNOSIS — Z7982 Long term (current) use of aspirin: Secondary | ICD-10-CM | POA: Diagnosis not present

## 2016-12-12 DIAGNOSIS — X501XXA Overexertion from prolonged static or awkward postures, initial encounter: Secondary | ICD-10-CM | POA: Diagnosis not present

## 2016-12-12 DIAGNOSIS — Z87891 Personal history of nicotine dependence: Secondary | ICD-10-CM | POA: Diagnosis not present

## 2016-12-12 DIAGNOSIS — I10 Essential (primary) hypertension: Secondary | ICD-10-CM | POA: Insufficient documentation

## 2016-12-12 DIAGNOSIS — Y929 Unspecified place or not applicable: Secondary | ICD-10-CM | POA: Insufficient documentation

## 2016-12-12 DIAGNOSIS — R319 Hematuria, unspecified: Secondary | ICD-10-CM

## 2016-12-12 DIAGNOSIS — S39012A Strain of muscle, fascia and tendon of lower back, initial encounter: Secondary | ICD-10-CM

## 2016-12-12 DIAGNOSIS — Z7984 Long term (current) use of oral hypoglycemic drugs: Secondary | ICD-10-CM | POA: Insufficient documentation

## 2016-12-12 DIAGNOSIS — Y9389 Activity, other specified: Secondary | ICD-10-CM | POA: Insufficient documentation

## 2016-12-12 LAB — CBC WITH DIFFERENTIAL/PLATELET
Basophils Absolute: 0.1 10*3/uL (ref 0–0.1)
Basophils Relative: 1 %
EOS ABS: 0.1 10*3/uL (ref 0–0.7)
Eosinophils Relative: 1 %
HEMATOCRIT: 39.8 % — AB (ref 40.0–52.0)
HEMOGLOBIN: 13.8 g/dL (ref 13.0–18.0)
LYMPHS ABS: 1.9 10*3/uL (ref 1.0–3.6)
LYMPHS PCT: 30 %
MCH: 29.3 pg (ref 26.0–34.0)
MCHC: 34.6 g/dL (ref 32.0–36.0)
MCV: 84.7 fL (ref 80.0–100.0)
Monocytes Absolute: 0.7 10*3/uL (ref 0.2–1.0)
Monocytes Relative: 10 %
NEUTROS ABS: 3.7 10*3/uL (ref 1.4–6.5)
NEUTROS PCT: 58 %
Platelets: 233 10*3/uL (ref 150–440)
RBC: 4.7 MIL/uL (ref 4.40–5.90)
RDW: 14.4 % (ref 11.5–14.5)
WBC: 6.4 10*3/uL (ref 3.8–10.6)

## 2016-12-12 LAB — URINALYSIS, COMPLETE (UACMP) WITH MICROSCOPIC
Bilirubin Urine: NEGATIVE
GLUCOSE, UA: NEGATIVE mg/dL
HGB URINE DIPSTICK: NEGATIVE
KETONES UR: NEGATIVE mg/dL
Nitrite: POSITIVE — AB
PROTEIN: NEGATIVE mg/dL
Specific Gravity, Urine: 1.016 (ref 1.005–1.030)
pH: 8 (ref 5.0–8.0)

## 2016-12-12 LAB — BASIC METABOLIC PANEL
ANION GAP: 6 (ref 5–15)
BUN: 28 mg/dL — ABNORMAL HIGH (ref 6–20)
CHLORIDE: 104 mmol/L (ref 101–111)
CO2: 29 mmol/L (ref 22–32)
CREATININE: 1.27 mg/dL — AB (ref 0.61–1.24)
Calcium: 10.1 mg/dL (ref 8.9–10.3)
GFR calc non Af Amer: 56 mL/min — ABNORMAL LOW (ref >60)
Glucose, Bld: 103 mg/dL — ABNORMAL HIGH (ref 65–99)
POTASSIUM: 3.9 mmol/L (ref 3.5–5.1)
SODIUM: 139 mmol/L (ref 135–145)

## 2016-12-12 MED ORDER — IBUPROFEN 600 MG PO TABS
600.0000 mg | ORAL_TABLET | Freq: Once | ORAL | Status: AC
  Administered 2016-12-12: 600 mg via ORAL
  Filled 2016-12-12: qty 1

## 2016-12-12 MED ORDER — DEXTROSE 5 % IV SOLN: 1.0000 g | Freq: Once | INTRAVENOUS | Status: DC

## 2016-12-12 MED ORDER — SODIUM CHLORIDE 0.9 % IV BOLUS (SEPSIS)
1000.0000 mL | Freq: Once | INTRAVENOUS | Status: AC
  Administered 2016-12-12: 1000 mL via INTRAVENOUS

## 2016-12-12 MED ORDER — CEPHALEXIN 500 MG PO CAPS: 500.0000 mg | ORAL_CAPSULE | Freq: Two times a day (BID) | ORAL | 0 refills | Status: DC

## 2016-12-12 MED ORDER — CYCLOBENZAPRINE HCL 10 MG PO TABS
10.0000 mg | ORAL_TABLET | Freq: Once | ORAL | Status: AC
  Administered 2016-12-12: 10 mg via ORAL
  Filled 2016-12-12: qty 1

## 2016-12-12 MED ORDER — CYCLOBENZAPRINE HCL 10 MG PO TABS: 10.0000 mg | ORAL_TABLET | Freq: Three times a day (TID) | ORAL | 0 refills | Status: DC | PRN

## 2016-12-12 MED ORDER — CEFTRIAXONE SODIUM-DEXTROSE 1-3.74 GM-% IV SOLR
1.0000 g | Freq: Once | INTRAVENOUS | Status: AC
  Administered 2016-12-12: 1 g via INTRAVENOUS
  Filled 2016-12-12: qty 50

## 2016-12-12 MED ORDER — IBUPROFEN 600 MG PO TABS: 600.0000 mg | ORAL_TABLET | Freq: Three times a day (TID) | ORAL | 0 refills | Status: DC | PRN

## 2016-12-12 NOTE — ED Provider Notes (Signed)
[facility] Emergency Department Provider Note ____________________________________________   I have reviewed the triage vital signs and the triage nursing note.  HISTORY  Chief Complaint Back Pain and Urinary Retention   Historian Patient  HPI Vincent Bautista is a 70 y.o. male with a history of bph and prior urinary retention, presents for low back pain which starteda few days ago after bending down to his cabinets and when he stood up he felt a pull or strain in his low back. Since then he's had pain in his low back both sides when he goes from lying to sitting position and pins or twists. He's had some pain that goes down the left buttock and right buttock but states the right is slightly worse. No numbness or tingling. He felt like his abdomen was a little bloated today and that his symptoms might have been similar to when he had urinary retention in the past and wanted to be checked for that. Denies abdominal pain. Reports some decreased frequency of urination. He states that he did urinate this morning.  Pain is moderate in the low back. And as above worse with movements.    Past Medical History:  Diagnosis Date  . Bilateral kidney stones   . BPH (benign prostatic hypertrophy)   . Diabetes mellitus, type 2 (HCC)   . Hematuria    gross  . HTN (hypertension)   . Urinary retention     There are no active problems to display for this patient.   Past Surgical History:  Procedure Laterality Date  . none      Prior to Admission medications   Medication Sig Start Date End Date Taking? Authorizing Provider  aspirin 81 MG tablet Take 81 mg by mouth daily.   Yes Historical Provider, MD  finasteride (PROSCAR) 5 MG tablet Take 1 tablet (5 mg total) by mouth daily. 06/01/16  Yes Shannon A McGowan, PA-C  glipiZIDE (GLUCOTROL) 5 MG tablet Take 5 mg by mouth daily.  04/16/16  Yes Historical Provider, MD  hydrochlorothiazide (HYDRODIURIL) 25 MG tablet Take 25 mg by  mouth daily.  04/05/16  Yes Historical Provider, MD  lisinopril (PRINIVIL,ZESTRIL) 10 MG tablet Take 10 mg by mouth daily.  04/16/16  Yes Historical Provider, MD  simvastatin (ZOCOR) 20 MG tablet Take 20 mg by mouth daily.  04/05/16  Yes Historical Provider, MD  tamsulosin (FLOMAX) 0.4 MG CAPS capsule Take 1 capsule (0.4 mg total) by mouth daily. 07/07/16  Yes Shannon A McGowan, PA-C  cephALEXin (KEFLEX) 500 MG capsule Take 1 capsule (500 mg total) by mouth 2 (two) times daily. 12/12/16   Governor Rooksebecca Dovber Ernest, MD  cyclobenzaprine (FLEXERIL) 10 MG tablet Take 1 tablet (10 mg total) by mouth 3 (three) times daily as needed for muscle spasms. 12/12/16   Governor Rooksebecca Tranell Wojtkiewicz, MD  ibuprofen (ADVIL,MOTRIN) 600 MG tablet Take 1 tablet (600 mg total) by mouth every 8 (eight) hours as needed. 12/12/16   Governor Rooksebecca Darlin Stenseth, MD  tadalafil (CIALIS) 5 MG tablet Take 5 mg by mouth daily as needed for erectile dysfunction.    Historical Provider, MD    Allergies  Allergen Reactions  . Codeine     Family History  Problem Relation Age of Onset  . Colon cancer Brother   . Cancer    . Prostate cancer Brother   . Lung cancer Father   . Kidney disease Neg Hx     Social History Social History  Substance Use Topics  . Smoking status: Former  Smoker  . Smokeless tobacco: Never Used     Comment: quit 30 years  . Alcohol use No    Review of Systems  Constitutional: Negative for fever. Eyes: Negative for visual changes. ENT: Negative for sore throat. Cardiovascular: Negative for chest pain. Respiratory: Negative for shortness of breath. Gastrointestinal: Negative for vomiting and diarrhea. Genitourinary: Negative for Hematuria. Musculoskeletal: Low back pain or significant as per history of present illness Skin: Negative for rash. Neurological: Negative for headache. 10 point Review of Systems otherwise negative ____________________________________________   PHYSICAL EXAM:  VITAL SIGNS: ED Triage Vitals  Enc Vitals  Group     BP 12/12/16 1045 (!) 152/82     Pulse Rate 12/12/16 1045 100     Resp 12/12/16 1045 18     Temp 12/12/16 1045 98.7 F (37.1 C)     Temp Source 12/12/16 1045 Oral     SpO2 12/12/16 1045 97 %     Weight 12/12/16 1046 157 lb (71.2 kg)     Height 12/12/16 1046 5\' 4"  (1.626 m)     Head Circumference --      Peak Flow --      Pain Score 12/12/16 1046 0     Pain Loc --      Pain Edu? --      Excl. in GC? --      Constitutional: Alert and oriented. Well appearing and in no distress. HEENT   Head: Normocephalic and atraumatic.      Eyes: Conjunctivae are normal. PERRL. Normal extraocular movements.      Ears:         Nose: No congestion/rhinnorhea.   Mouth/Throat: Mucous membranes are moist.   Neck: No stridor. Cardiovascular/Chest: Normal rate, regular rhythm.  No murmurs, rubs, or gallops. Respiratory: Normal respiratory effort without tachypnea nor retractions. Breath sounds are clear and equal bilaterally. No wheezes/rales/rhonchi. Gastrointestinal: Soft. No distention, no guarding, no rebound. Nontender.   Genitourinary/rectal:Deferred Musculoskeletal: Some tightening of the low back muscles of the lumbar area paraspinous without focal point tenderness over the spine. No point tenderness in the mid buttock.   Nontender with normal range of motion in all extremities. No joint effusions.  No lower extremity tenderness.  No edema. Neurologic:  Normal speech and language. No gross or focal neurologic deficits are appreciated. Skin:  Skin is warm, dry and intact. No rash noted. Psychiatric: Mood and affect are normal. Speech and behavior are normal. Patient exhibits appropriate insight and judgment.   ____________________________________________  LABS (pertinent positives/negatives)  Labs Reviewed  BASIC METABOLIC PANEL - Abnormal; Notable for the following:       Result Value   Glucose, Bld 103 (*)    BUN 28 (*)    Creatinine, Ser 1.27 (*)    GFR calc non Af  Amer 56 (*)    All other components within normal limits  CBC WITH DIFFERENTIAL/PLATELET - Abnormal; Notable for the following:    HCT 39.8 (*)    All other components within normal limits  URINALYSIS, COMPLETE (UACMP) WITH MICROSCOPIC - Abnormal; Notable for the following:    Color, Urine YELLOW (*)    APPearance HAZY (*)    Nitrite POSITIVE (*)    Leukocytes, UA LARGE (*)    Bacteria, UA RARE (*)    Squamous Epithelial / LPF 0-5 (*)    All other components within normal limits  URINE CULTURE    ____________________________________________    EKG I, Governor Rooksebecca Turon Kilmer, MD, the attending physician have  personally viewed and interpreted all ECGs.  None ____________________________________________  RADIOLOGY All Xrays were viewed by me. Imaging interpreted by Radiologist.  None __________________________________________  PROCEDURES  Procedure(s) performed: None  Critical Care performed: None  ____________________________________________   ED COURSE / ASSESSMENT AND PLAN  Pertinent labs & imaging results that were available during my care of the patient were reviewed by me and considered in my medical decision making (see chart for details).   Mr. Serano is here for low back pain at after a strain a few days ago, and certainly his symptoms seem very musculoskeletal and he was given ibuprofen and Flexeril with some improvement there.  This is significant trauma in terms of injury and no pain directly over the spine and some not recommending imaging at this point in time.  He was also concerned about possible urinary symptoms with decreased frequency of urination and history of BPH. Laboratory studies are reassuring overall. Urinalysis is consistent with nitrite positive urinary tract infection. I don't think he is having symptoms of sepsis. I am going to give him a IV dose of Rocephin here in the emergency department. A culture will be sent. I will discharge him with  Keflex.  Only minimal blood in the urinalysis and lumbar pain is both sides, and so don't have a high suspicion for kidney stone. His BUN and creatinine are just barely up from previous. He was given 1 L bolus here in the emergency department.  CONSULTATIONS:   None   Patient / Family / Caregiver informed of clinical course, medical decision-making process, and agree with plan.   I discussed return precautions, follow-up instructions, and discharge instructions with patient and/or family.   ___________________________________________   FINAL CLINICAL IMPRESSION(S) / ED DIAGNOSES   Final diagnoses:  Strain of lumbar region, initial encounter  Urinary tract infection with hematuria, site unspecified              Note: This dictation was prepared with Dragon dictation. Any transcriptional errors that result from this process are unintentional    Governor Rooks, MD 12/12/16 1439

## 2016-12-12 NOTE — ED Notes (Signed)
Patient reports lower back pain for the last few days. Patient states that he was bending over looking in the cabinet and had a pain go through his back. Patient has tried "pain relief gel" and heating pad with some relief. Patient also has a history of prostate issues and has had back pain when he had urinary retention. Patient states he is urinating without difficulty.

## 2016-12-12 NOTE — ED Triage Notes (Signed)
Pt presents to ED with c/o back pain. States in the past he has had back pain that was from urinary retention and has a hx of BPH. Pt states has been urinating okay at this time, states last urination was this morning.

## 2016-12-12 NOTE — ED Notes (Signed)
Bladder scan done. of urine in bladder

## 2016-12-12 NOTE — Discharge Instructions (Signed)
You were evaluated for low back pain and found to have symptoms consistent with low back strain and we discussed treatment with ibuprofen and muscle relaxer.  Your urine sample also showed signs of urinary tract infection he were given one dose of IV Rocephin which will cover U for 24 hours, please start your antibiotic Keflex tomorrow.  Return to the emergency room for any weakness or numbness or incontinence, abdominal pain, worsening back pain, or any other symptoms concerning to you.  Please follow up with your primary doctor in about one week.

## 2016-12-16 LAB — URINE CULTURE

## 2016-12-18 NOTE — Progress Notes (Signed)
ED Culture Results   Allergies: Codeine Visit Date: 12/12/16 Chief Complaint: Back pain and urinary retention Culture Type: urine  Culture Results: Morganella Morganii Original Abx given: Cephalexin  Original Abx sensitive, intermediate, or resistant: resistant  Recommended Abx: Cipro 250mg  BID x7 days ED Physician: Phineas SemenGraydon Goodman Contacted Patient: Yes, patient aware of new antibiotic and was told to stop taking Cephalexin.  Prescription Called into: [facility]: [address], [place], [place] [id number]  Delsa BernKelly m Neda Willenbring, PharmD 4:56 PM 12/18/2016

## 2016-12-23 ENCOUNTER — Ambulatory Visit (INDEPENDENT_AMBULATORY_CARE_PROVIDER_SITE_OTHER): Payer: Medicare Other | Admitting: Urology

## 2016-12-23 ENCOUNTER — Encounter: Payer: Self-pay | Admitting: Urology

## 2016-12-23 VITALS — BP 117/79 | HR 103 | Ht 64.0 in | Wt 158.8 lb

## 2016-12-23 DIAGNOSIS — N138 Other obstructive and reflux uropathy: Secondary | ICD-10-CM | POA: Diagnosis not present

## 2016-12-23 DIAGNOSIS — R339 Retention of urine, unspecified: Secondary | ICD-10-CM | POA: Diagnosis not present

## 2016-12-23 DIAGNOSIS — N401 Enlarged prostate with lower urinary tract symptoms: Secondary | ICD-10-CM

## 2016-12-23 DIAGNOSIS — N400 Enlarged prostate without lower urinary tract symptoms: Secondary | ICD-10-CM | POA: Diagnosis not present

## 2016-12-23 LAB — BLADDER SCAN AMB NON-IMAGING: SCAN RESULT: 472

## 2016-12-23 MED ORDER — TAMSULOSIN HCL 0.4 MG PO CAPS: 0.4000 mg | ORAL_CAPSULE | Freq: Every day | ORAL | 3 refills | Status: DC

## 2016-12-23 MED ORDER — FINASTERIDE 5 MG PO TABS: 5.0000 mg | ORAL_TABLET | Freq: Every day | ORAL | 3 refills | Status: DC

## 2016-12-23 NOTE — Progress Notes (Addendum)
9:26 AM   Vincent Bautista 07-Nov-1946 [id number]  Referring provider: Oswaldo Conroy, MD [address] [place], [place] [address]  Chief Complaint  Patient presents with  . Benign Prostatic Hypertrophy    3 months follow up    HPI: Patient is a 71 year old [place] American male who presents today for a 3 month follow up for BPH with urinary retention.   History of urinary retention Patient was found to be in urinary retention after suffering a TIA in December 2015. Patient was initiated on finasteride and Flomax.  He did undergo cystoscopy in June 2015 and was found to have an enlarged prostate and a bladder diverticulum with hypospadias.  He continues to have large residuals and has failed two voiding trials.  He does not have a good understanding of his situation as he continually asks for fluids pills to help him urinate.  His most recent serum creatinine was 1.27 mg/dL on 08/65/7846, which is baseline.  He was seen in the ED on 12/12/2016 for low back pain.  His PVR at that time and it was 196 mL.  He UA was suspicious for infection and he was started on an antibiotic.  His urine culture was positive for morganella morganii and he was changed to Septra DS.  He is still on the antibiotic.     BPH WITH LUTS His most recent IPSS score was 12, which is moderate lower urinary tract symptomatology. He is mostly satisfied with his quality life due to his urinary symptoms.  His PVR today is 472 mL.  His previous IPSS was 1/2.   He denies any dysuria, hematuria or suprapubic pain.  He is currently taking tamsulosin 0.4 mg daily and finasteride 5 mg daily.  He also denies any recent fevers, chills, nausea or vomiting.  He has a family history of prostate cancer in his brother.        IPSS    Row Name 12/23/16 0800         International Prostate Symptom Score   How often have you had the sensation of not emptying your bladder? Less than half the time     How often have  you had to urinate less than every two hours? Less than half the time     How often have you found you stopped and started again several times when you urinated? Less than half the time     How often have you found it difficult to postpone urination? Less than half the time     How often have you had a weak urinary stream? Less than half the time     How often have you had to strain to start urination? Not at All     How many times did you typically get up at night to urinate? 2 Times     Total IPSS Score 12       Quality of Life due to urinary symptoms   If you were to spend the rest of your life with your urinary condition just the way it is now how would you feel about that? Mostly Satisfied        Score:  1-7 Mild 8-19 Moderate 20-35 Severe     PMH: Past Medical History:  Diagnosis Date  . Bilateral kidney stones   . BPH (benign prostatic hypertrophy)   . Diabetes mellitus, type 2 (HCC)   . Hematuria    gross  . HTN (hypertension)   .  Urinary retention     Surgical History: Past Surgical History:  Procedure Laterality Date  . none      Home Medications:  Allergies as of 12/23/2016      Reactions   Codeine       Medication List       Accurate as of 12/23/16  9:26 AM. Always use your most recent med list.          aspirin 81 MG tablet Take 81 mg by mouth daily.   cephALEXin 500 MG capsule Commonly known as:  KEFLEX Take 1 capsule (500 mg total) by mouth 2 (two) times daily.   cyclobenzaprine 10 MG tablet Commonly known as:  FLEXERIL Take 1 tablet (10 mg total) by mouth 3 (three) times daily as needed for muscle spasms.   finasteride 5 MG tablet Commonly known as:  PROSCAR Take 1 tablet (5 mg total) by mouth daily.   glipiZIDE 5 MG tablet Commonly known as:  GLUCOTROL Take 5 mg by mouth daily.   hydrochlorothiazide 25 MG tablet Commonly known as:  HYDRODIURIL Take 25 mg by mouth daily.   ibuprofen 600 MG tablet Commonly known as:   ADVIL,MOTRIN Take 1 tablet (600 mg total) by mouth every 8 (eight) hours as needed.   lisinopril 10 MG tablet Commonly known as:  PRINIVIL,ZESTRIL Take 10 mg by mouth daily.   simvastatin 20 MG tablet Commonly known as:  ZOCOR Take 20 mg by mouth daily.   tadalafil 5 MG tablet Commonly known as:  CIALIS Take 5 mg by mouth daily as needed for erectile dysfunction.   tamsulosin 0.4 MG Caps capsule Commonly known as:  FLOMAX Take 1 capsule (0.4 mg total) by mouth daily.       Allergies:  Allergies  Allergen Reactions  . Codeine     Family History: Family History  Problem Relation Age of Onset  . Colon cancer Brother   . Cancer    . Prostate cancer Brother   . Lung cancer Father   . Kidney disease Neg Hx   . Bladder Cancer Neg Hx     Social History:  reports that he has quit smoking. He has never used smokeless tobacco. He reports that he does not drink alcohol or use drugs.  ROS: UROLOGY Frequent Urination?: No Hard to postpone urination?: No Burning/pain with urination?: No Get up at night to urinate?: No Leakage of urine?: No Urine stream starts and stops?: No Trouble starting stream?: No Do you have to strain to urinate?: No Blood in urine?: No Urinary tract infection?: Yes Sexually transmitted disease?: No Injury to kidneys or bladder?: No Painful intercourse?: No Weak stream?: No Erection problems?: No Penile pain?: No  Gastrointestinal Nausea?: No Vomiting?: No Indigestion/heartburn?: No Diarrhea?: No Constipation?: No  Constitutional Fever: No Night sweats?: No Weight loss?: No Fatigue?: No  Skin Skin rash/lesions?: No Itching?: No  Eyes Blurred vision?: No Double vision?: No  Ears/Nose/Throat Sore throat?: No Sinus problems?: No  Hematologic/Lymphatic Swollen glands?: No Easy bruising?: No  Cardiovascular Leg swelling?: No Chest pain?: No  Respiratory Cough?: No Shortness of breath?: No  Endocrine Excessive  thirst?: No  Musculoskeletal Back pain?: No Joint pain?: No  Neurological Headaches?: No Dizziness?: No  Psychologic Depression?: No Anxiety?: No  Physical Exam: BP 117/79   Pulse (!) 103   Ht 5\' 4"  (1.626 m)   Wt 158 lb 12.8 oz (72 kg)   BMI 27.26 kg/m    Constitutional: Well nourished. Alert and oriented,  No acute distress. HEENT: [place] AT, moist mucus membranes. Trachea midline, no masses. Cardiovascular: No clubbing, cyanosis, or edema. Respiratory: Normal respiratory effort, no increased work of breathing. GI: Abdomen is soft, non tender, non distended, no abdominal masses. Liver and spleen not palpable.  No hernias appreciated.  Stool sample for occult testing is not indicated.   GU: No CVA tenderness.  No bladder fullness or masses.  Patient with uncircumcised phallus.  Foreskin easily retracted  Urethral meatus is patent.  Coronal hypospadias.  No penile discharge. No penile lesions or rashes. Scrotum without lesions, cysts, rashes and/or edema.  Testicles are located scrotally bilaterally. No masses are appreciated in the testicles. Left and right epididymis are normal. Rectal: Patient with  normal sphincter tone. Anus and perineum without scarring or rashes. No rectal masses are appreciated. DRE limited to patient's body habitus; could not appreciate the prostate Skin: No rashes, bruises or suspicious lesions. Lymph: No cervical or inguinal adenopathy. Neurologic: Grossly intact, no focal deficits, moving all 4 extremities. Psychiatric: Normal mood and affect.  Laboratory Data: Lab Results  Component Value Date   WBC 6.4 12/12/2016   HGB 13.8 12/12/2016   HCT 39.8 (L) 12/12/2016   MCV 84.7 12/12/2016   PLT 233 12/12/2016    Lab Results  Component Value Date   CREATININE 1.27 (H) 12/12/2016    Lab Results  Component Value Date   PSA 3.1 12/01/2014  PSA   0.2 ng/mL on 06/01/2016  Lab Results  Component Value Date   AST 21 06/14/2016   Lab Results   Component Value Date   ALT 16 (L) 06/14/2016    Pertinent imaging Results for DEJAY, KRONK (MRN [id number]) as of 12/23/2016 09:27  Ref. Range 12/23/2016 09:05  Scan Result Unknown [id number]   Assessment & Plan:    1. Urinary retention  - still with large residuals, but he is not uncomfortable and serum creatinine is at baseline  - will continue to monitor  - he will RTC in 6 months for IPSS score and PVR   - advised to contact office or seek treatment in the ED if unable to void or becomes uncomfortable  2. BPH (benign prostatic hyperplasia) with urinary retention:   IPSS score is 12/2.  It is worsening.  I reminded the patient the ramifications of having an large postvoid residual, such as urinary tract infections, bladder calculi and renal failure.  He will continue the finasteride 5 mg daily and he will continue the tamsulosin 0.4 mg daily; refills given  - RTC in 6 months for IPSS and PVR  - PSA   Return in about 6 months (around 06/22/2017) for IPSS, PVR, PSA and exam.  These notes generated with voice recognition software. I apologize for typographical errors.  Michiel Cowboy, PA-C  [facility] [address], [address] [place], [place] [phone]

## 2016-12-24 ENCOUNTER — Telehealth: Payer: Self-pay

## 2016-12-24 LAB — PSA: PROSTATE SPECIFIC AG, SERUM: 0.1 ng/mL (ref 0.0–4.0)

## 2016-12-24 NOTE — Telephone Encounter (Signed)
Spoke with pt in reference to PSA results. Pt voiced understanding.  

## 2016-12-24 NOTE — Telephone Encounter (Signed)
Spoke with someone at pt home to who stated they would have pt return call.

## 2016-12-24 NOTE — Telephone Encounter (Signed)
-----   Message from Harle BattiestShannon A McGowan, PA-C sent at 12/24/2016  9:57 AM EST ----- Please notify the patient that his PSA is stable at 0.1.  We will see him in 6 months.

## 2017-06-21 ENCOUNTER — Other Ambulatory Visit: Payer: Medicare Other

## 2017-06-21 ENCOUNTER — Encounter: Payer: Self-pay | Admitting: Urology

## 2017-06-23 NOTE — Progress Notes (Signed)
9:10 AM   Vincent PengEarl W Ernest Bautista [birth year] [id number]  Referring provider: Oswaldo ConroyBender, Abby Daneele, MD [address] [place], [place] [phone]  Chief Complaint  Patient presents with  . Benign Prostatic Hypertrophy    HPI: Patient is a [age] year old [place] male who presents today for a 6 month follow up for BPH with urinary retention.   History of urinary retention Patient was found to be in urinary retention after suffering a TIA in December 2015. Patient was initiated on finasteride and Flomax.  He did undergo cystoscopy in June 2015 and was found to have an enlarged prostate and a bladder diverticulum with hypospadias.  He continues to have large residuals and has failed two voiding trials.  He does not have a good understanding of his situation as he continually asks for fluids pills to help him urinate.  His most recent serum creatinine was 1.27 mg/dL on 78/29/562112/23/2017, which is baseline.    BPH WITH LUTS Today's I PSS score was 12, which is moderate lower urinary tract symptomatology. He is pleased with his quality life due to his urinary symptoms.  His PVR today is 493 mL.  His previous IPSS was 12/2.Marland Kitchen.  His previous PVR score was 472 mL.  He denies any dysuria, hematuria or suprapubic pain.  He is currently taking tamsulosin 0.4 mg daily and finasteride 5 mg daily.  He also denies any recent fevers, chills, nausea or vomiting.  He has a family history of prostate cancer in his brother.        IPSS    Row Name 06/24/17 0900         International Prostate Symptom Score   How often have you had the sensation of not emptying your bladder? More than half the time     How often have you had to urinate less than every two hours? About half the time     How often have you found you stopped and started again several times when you urinated? Less than half the time     How often have you found it difficult to postpone urination? Not at All     How often have you had a weak  urinary stream? About half the time     How often have you had to strain to start urination? Not at All     How many times did you typically get up at night to urinate? None     Total IPSS Score 12       Quality of Life due to urinary symptoms   If you were to spend the rest of your life with your urinary condition just the way it is now how would you feel about that? Pleased        Score:  1-7 Mild 8-19 Moderate 20-35 Severe     PMH: Past Medical History:  Diagnosis Date  . Bilateral kidney stones   . BPH (benign prostatic hypertrophy)   . Diabetes mellitus, type 2 (HCC)   . Hematuria    gross  . HTN (hypertension)   . Urinary retention     Surgical History: Past Surgical History:  Procedure Laterality Date  . none      Home Medications:  Allergies as of 06/24/2017      Reactions   Codeine       Medication List       Accurate as of 06/24/17  9:10 AM. Always use your most recent med list.  aspirin 81 MG tablet Take 81 mg by mouth daily.   finasteride 5 MG tablet Commonly known as:  PROSCAR Take 1 tablet (5 mg total) by mouth daily.   glipiZIDE 5 MG tablet Commonly known as:  GLUCOTROL Take 5 mg by mouth daily.   hydrochlorothiazide 25 MG tablet Commonly known as:  HYDRODIURIL Take 25 mg by mouth daily.   ibuprofen 600 MG tablet Commonly known as:  ADVIL,MOTRIN Take 1 tablet (600 mg total) by mouth every 8 (eight) hours as needed.   lisinopril 10 MG tablet Commonly known as:  PRINIVIL,ZESTRIL Take 10 mg by mouth daily.   simvastatin 20 MG tablet Commonly known as:  ZOCOR Take 20 mg by mouth daily.   tamsulosin 0.4 MG Caps capsule Commonly known as:  FLOMAX Take 1 capsule (0.4 mg total) by mouth daily.       Allergies:  Allergies  Allergen Reactions  . Codeine     Family History: Family History  Problem Relation Age of Onset  . Colon cancer Brother   . Cancer Unknown   . Prostate cancer Brother   . Lung cancer Father     . Kidney disease Neg Hx   . Bladder Cancer Neg Hx     Social History:  reports that he has quit smoking. He has never used smokeless tobacco. He reports that he does not drink alcohol or use drugs.  ROS: UROLOGY Frequent Urination?: No Hard to postpone urination?: No Burning/pain with urination?: No Get up at night to urinate?: No Leakage of urine?: No Urine stream starts and stops?: No Trouble starting stream?: No Do you have to strain to urinate?: No Blood in urine?: No Urinary tract infection?: No Sexually transmitted disease?: No Injury to kidneys or bladder?: No Painful intercourse?: No Weak stream?: No Erection problems?: No Penile pain?: No  Gastrointestinal Nausea?: No Vomiting?: No Indigestion/heartburn?: No Diarrhea?: No Constipation?: No  Constitutional Fever: No Night sweats?: No Weight loss?: No Fatigue?: No  Skin Skin rash/lesions?: No Itching?: No  Eyes Blurred vision?: No Double vision?: No  Ears/Nose/Throat Sore throat?: No Sinus problems?: No  Hematologic/Lymphatic Swollen glands?: No Easy bruising?: No  Cardiovascular Leg swelling?: No Chest pain?: No  Respiratory Cough?: No Shortness of breath?: No  Endocrine Excessive thirst?: No  Musculoskeletal Back pain?: No Joint pain?: No  Neurological Headaches?: No Dizziness?: No  Psychologic Depression?: No Anxiety?: No  Physical Exam: BP (!) 154/65   Pulse 81   Ht 5\' 4"  (1.626 m)   Wt 156 lb (70.8 kg)   BMI 26.78 kg/m   Constitutional: Well nourished. Alert and oriented, No acute distress. HEENT: [place] AT, moist mucus membranes. Trachea midline, no masses. Cardiovascular: No clubbing, cyanosis, or edema. Respiratory: Normal respiratory effort, no increased work of breathing. GI: Abdomen is soft, non tender, non distended, no abdominal masses. Liver and spleen not palpable.  No hernias appreciated.  Stool sample for occult testing is not indicated.   GU: No CVA  tenderness.  No bladder fullness or masses.  Patient with uncircumcised phallus.  Foreskin easily retracted  Urethral meatus is patent.  Coronal hypospadias.  No penile discharge. No penile lesions or rashes. Scrotum without lesions, cysts, rashes and/or edema.  Testicles are located scrotally bilaterally. No masses are appreciated in the testicles. Left and right epididymis are normal. Rectal: Patient with  normal sphincter tone. Anus and perineum without scarring or rashes. No rectal masses are appreciated. DRE limited to patient's body habitus; could not appreciate the prostate  Skin: No rashes, bruises or suspicious lesions. Lymph: No cervical or inguinal adenopathy. Neurologic: Grossly intact, no focal deficits, moving all 4 extremities. Psychiatric: Normal mood and affect.  Laboratory Data: Lab Results  Component Value Date   WBC 6.4 12/12/2016   HGB 13.8 12/12/2016   HCT 39.8 (L) 12/12/2016   MCV 84.7 12/12/2016   PLT 233 12/12/2016    Lab Results  Component Value Date   CREATININE 1.27 (H) 12/12/2016    Lab Results  Component Value Date   PSA 3.1 12/01/2014  PSA   0.2 ng/mL on 06/01/2016  I have reviewed the labs.    Pertinent imaging Results for Vincent Bautista, Vincent Bautista (MRN [id number]) as of 06/24/2017 09:02  Ref. Range 06/24/2017 08:57  Scan Result Unknown    Assessment & Plan:    1. Urinary retention  - still with large residuals, will check creatinine at this time  - will continue to monitor  - he will RTC pending creatinine  - advised to contact office or seek treatment in the ED if unable to void or becomes uncomfortable  2. BPH (benign prostatic hyperplasia) with urinary retention:   IPSS score is 12/1.  It is improved.  I reviewed with the patient the ramifications of having an large postvoid residual, such as urinary tract infections, bladder calculi and renal failure.  He will continue the finasteride 5 mg daily and he will continue the tamsulosin 0.4 mg daily;  refills given  - RTC pending PSA results  - PSA drawn today   3. Bladder diverticulum  - may be contributing the large residuals  Return in about 6 months (around 12/25/2017) for IPSS, PVR, PSA and exam.  These notes generated with voice recognition software. I apologize for typographical errors.  Michiel Cowboy, PA-C  [facility] [address], [address] [place], [place] [address] [phone]

## 2017-06-24 ENCOUNTER — Encounter: Payer: Self-pay | Admitting: Urology

## 2017-06-24 ENCOUNTER — Ambulatory Visit (INDEPENDENT_AMBULATORY_CARE_PROVIDER_SITE_OTHER): Payer: Medicare Other | Admitting: Urology

## 2017-06-24 VITALS — BP 154/65 | HR 81 | Ht 64.0 in | Wt 156.0 lb

## 2017-06-24 DIAGNOSIS — N323 Diverticulum of bladder: Secondary | ICD-10-CM | POA: Diagnosis not present

## 2017-06-24 DIAGNOSIS — N400 Enlarged prostate without lower urinary tract symptoms: Secondary | ICD-10-CM

## 2017-06-24 DIAGNOSIS — R339 Retention of urine, unspecified: Secondary | ICD-10-CM

## 2017-06-24 DIAGNOSIS — N138 Other obstructive and reflux uropathy: Secondary | ICD-10-CM

## 2017-06-24 DIAGNOSIS — N401 Enlarged prostate with lower urinary tract symptoms: Secondary | ICD-10-CM | POA: Diagnosis not present

## 2017-06-24 LAB — BLADDER SCAN AMB NON-IMAGING

## 2017-06-25 ENCOUNTER — Telehealth: Payer: Self-pay

## 2017-06-25 LAB — PSA: PROSTATE SPECIFIC AG, SERUM: 0.2 ng/mL (ref 0.0–4.0)

## 2017-06-25 LAB — CREATININE, SERUM
CREATININE: 1.21 mg/dL (ref 0.76–1.27)
GFR calc Af Amer: 70 mL/min/{1.73_m2} (ref >59)
GFR, EST NON AFRICAN AMERICAN: 61 mL/min/{1.73_m2} (ref >59)

## 2017-06-25 NOTE — Telephone Encounter (Signed)
Patient notified

## 2017-06-25 NOTE — Telephone Encounter (Signed)
-----   Message from Harle BattiestShannon A McGowan, PA-C sent at 06/25/2017  8:04 AM EDT ----- Please let [name] know that his kidney and PSA are normal.  We will see him in one year.

## 2017-08-18 ENCOUNTER — Other Ambulatory Visit: Payer: Self-pay | Admitting: Urology

## 2017-09-16 ENCOUNTER — Other Ambulatory Visit: Payer: Self-pay

## 2017-09-16 DIAGNOSIS — N401 Enlarged prostate with lower urinary tract symptoms: Secondary | ICD-10-CM

## 2017-09-16 MED ORDER — FINASTERIDE 5 MG PO TABS: 5.0000 mg | ORAL_TABLET | Freq: Every day | ORAL | 3 refills | Status: DC

## 2017-12-22 ENCOUNTER — Other Ambulatory Visit: Payer: Medicare Other

## 2017-12-23 ENCOUNTER — Other Ambulatory Visit: Payer: Self-pay | Admitting: Urology

## 2017-12-26 NOTE — Progress Notes (Signed)
9:20 AM   Vincent Bautista [name] 1946/05/17 [id number]  Referring provider: Oswaldo Conroy, MD [address] [place], [place] [address]  Chief Complaint  Patient presents with  . Benign Prostatic Hypertrophy    HPI: Patient is a 72 year old [place] male who presents today for a 6 month follow up for BPH with urinary retention.   History of urinary retention Patient was found to be in urinary retention after suffering a TIA in December 2015. Patient was initiated on finasteride and Flomax at that time.  He did undergo cystoscopy in June 2015 and was found to have an enlarged prostate, a bladder diverticulum with hypospadias.  He continues to have large residuals and has failed two voiding trials.  He does not have a good understanding of his situation as he continually asks for fluids pills to help him urinate.  His most recent serum creatinine was 1.21 mg/dL on 78/2956 which is baseline.    BPH WITH LUTS Today's I PSS score is 13/2.  His PVR today is 508 mL.  His previous IPSS was 12/1.Marland Kitchen  His previous PVR score was 493 mL.  He denies any dysuria, hematuria or suprapubic pain.  He is currently taking tamsulosin 0.4 mg daily and finasteride 5 mg daily.  He also denies any recent fevers, chills, nausea or vomiting.  He has a family history of prostate cancer in his brother.    IPSS    Row Name 12/27/17 0900         International Prostate Symptom Score   How often have you had the sensation of not emptying your bladder?  About half the time     How often have you had to urinate less than every two hours?  About half the time     How often have you found you stopped and started again several times when you urinated?  Less than 1 in 5 times     How often have you found it difficult to postpone urination?  Less than half the time     How often have you had a weak urinary stream?  Less than half the time     How often have you had to strain to start urination?  Less  than 1 in 5 times     How many times did you typically get up at night to urinate?  1 Time     Total IPSS Score  13       Quality of Life due to urinary symptoms   If you were to spend the rest of your life with your urinary condition just the way it is now how would you feel about that?  Mostly Satisfied        Score:  1-7 Mild 8-19 Moderate 20-35 Severe     PMH: Past Medical History:  Diagnosis Date  . Bilateral kidney stones   . BPH (benign prostatic hypertrophy)   . Diabetes mellitus, type 2 (HCC)   . Hematuria    gross  . HTN (hypertension)   . Urinary retention     Surgical History: Past Surgical History:  Procedure Laterality Date  . none      Home Medications:  Allergies as of 12/27/2017      Reactions   Codeine       Medication List        Accurate as of 12/27/17  9:20 AM. Always use your most recent med list.  aspirin 81 MG tablet Take 81 mg by mouth daily.   finasteride 5 MG tablet Commonly known as:  PROSCAR Take 1 tablet (5 mg total) by mouth daily.   glipiZIDE 5 MG tablet Commonly known as:  GLUCOTROL Take 5 mg by mouth daily.   hydrochlorothiazide 25 MG tablet Commonly known as:  HYDRODIURIL Take 25 mg by mouth daily.   ibuprofen 600 MG tablet Commonly known as:  ADVIL,MOTRIN Take 1 tablet (600 mg total) by mouth every 8 (eight) hours as needed.   lisinopril 10 MG tablet Commonly known as:  PRINIVIL,ZESTRIL Take 10 mg by mouth daily.   simvastatin 20 MG tablet Commonly known as:  ZOCOR Take 20 mg by mouth daily.   tamsulosin 0.4 MG Caps capsule Commonly known as:  FLOMAX Take 1 capsule (0.4 mg total) by mouth daily.       Allergies:  Allergies  Allergen Reactions  . Codeine     Family History: Family History  Problem Relation Age of Onset  . Colon cancer Brother   . Cancer Unknown   . Prostate cancer Brother   . Lung cancer Father   . Kidney disease Neg Hx   . Bladder Cancer Neg Hx     Social  History:  reports that he has quit smoking. he has never used smokeless tobacco. He reports that he does not drink alcohol or use drugs.  ROS: UROLOGY Frequent Urination?: No Hard to postpone urination?: No Burning/pain with urination?: No Get up at night to urinate?: No Leakage of urine?: No Urine stream starts and stops?: No Trouble starting stream?: No Do you have to strain to urinate?: No Blood in urine?: No Urinary tract infection?: No Sexually transmitted disease?: No Injury to kidneys or bladder?: No Painful intercourse?: No Weak stream?: No Erection problems?: No Penile pain?: Yes  Gastrointestinal Nausea?: No Vomiting?: No Indigestion/heartburn?: No Diarrhea?: No Constipation?: No  Constitutional Fever: No Night sweats?: No Weight loss?: No Fatigue?: No  Skin Skin rash/lesions?: No Itching?: No  Eyes Blurred vision?: No Double vision?: No  Ears/Nose/Throat Sore throat?: No Sinus problems?: No  Hematologic/Lymphatic Swollen glands?: No Easy bruising?: No  Cardiovascular Leg swelling?: No Chest pain?: No  Respiratory Cough?: No Shortness of breath?: No  Endocrine Excessive thirst?: No  Musculoskeletal Back pain?: No Joint pain?: No  Neurological Headaches?: No Dizziness?: No  Psychologic Depression?: No Anxiety?: No  Physical Exam: BP (!) 146/68 (BP Location: Right Arm, Patient Position: Sitting, Cuff Size: Normal)   Pulse 80   Ht 5\' 4"  (1.626 m)   Wt 156 lb 1.6 oz (70.8 kg)   BMI 26.79 kg/m   Constitutional: Well nourished. Alert and oriented, No acute distress. HEENT: [place] AT, moist mucus membranes. Trachea midline, no masses. Cardiovascular: No clubbing, cyanosis, or edema. Respiratory: Normal respiratory effort, no increased work of breathing. GI: Abdomen is soft, non tender, non distended, no abdominal masses. Liver and spleen not palpable.  No hernias appreciated.  Stool sample for occult testing is not indicated.   GU:  No CVA tenderness.  No bladder fullness or masses.  Patient with uncircumcised phallus.  Foreskin easily retracted  Urethral meatus is patent.  Coronal hypospadias.  No penile discharge. No penile lesions or rashes. Scrotum without lesions, cysts, rashes and/or edema.  Testicles are located scrotally bilaterally. No masses are appreciated in the testicles. Left and right epididymis are normal. Rectal: Patient with  normal sphincter tone. Anus and perineum without scarring or rashes. No rectal masses are appreciated.  Prostate could not be examined due to clenching of the buttocks.   Skin: No rashes, bruises or suspicious lesions. Lymph: No cervical or inguinal adenopathy. Neurologic: Grossly intact, no focal deficits, moving all 4 extremities. Psychiatric: Normal mood and affect.   Laboratory Data: Lab Results  Component Value Date   CREATININE 1.21 06/24/2017    Lab Results  Component Value Date   PSA 3.1 12/01/2014  PSA   0.2 ng/mL on 06/01/2016  I have reviewed the labs.    Pertinent imaging Results for Sue LushSLEY, Jumar W (MRN [id number]) as of 12/27/2017 09:11  Ref. Range 12/27/2017 09:09  Scan Result Unknown >508   Assessment & Plan:    1. History of urinary retention  - still with large residuals, will check creatinine at this time  - will continue to monitor  - he will RTC pending creatinine  - advised to contact office or seek treatment in the ED if unable to void or becomes uncomfortable  2. BPH (benign prostatic hyperplasia) with urinary retention:   IPSS score is 13/2.  It is slightly worse  He will continue the finasteride 5 mg daily and he will continue the tamsulosin 0.4 mg daily; refills given  - PSA drawn today   - RTC in 6 months for IPS'S, PSA, exam and PVR- if PSA is stable  3. Bladder diverticulum  - may be contributing the large residuals  Return in about 6 months (around 06/26/2018) for IPSS, PVR, creatinine, PSA and exam.  These notes generated with voice  recognition software. I apologize for typographical errors.  Michiel CowboySHANNON Tvisha Schwoerer, PA-C  [facility] [address], [address] [place], [place] [address] [phone]

## 2017-12-27 ENCOUNTER — Ambulatory Visit (INDEPENDENT_AMBULATORY_CARE_PROVIDER_SITE_OTHER): Payer: Medicare Other | Admitting: Urology

## 2017-12-27 ENCOUNTER — Encounter: Payer: Self-pay | Admitting: Urology

## 2017-12-27 VITALS — BP 146/68 | HR 80 | Ht 64.0 in | Wt 156.1 lb

## 2017-12-27 DIAGNOSIS — N323 Diverticulum of bladder: Secondary | ICD-10-CM

## 2017-12-27 DIAGNOSIS — N138 Other obstructive and reflux uropathy: Secondary | ICD-10-CM

## 2017-12-27 DIAGNOSIS — N401 Enlarged prostate with lower urinary tract symptoms: Secondary | ICD-10-CM | POA: Diagnosis not present

## 2017-12-27 DIAGNOSIS — Z87898 Personal history of other specified conditions: Secondary | ICD-10-CM

## 2017-12-27 LAB — BLADDER SCAN AMB NON-IMAGING

## 2017-12-27 MED ORDER — TAMSULOSIN HCL 0.4 MG PO CAPS: 0.4000 mg | ORAL_CAPSULE | Freq: Every day | ORAL | 3 refills | Status: DC

## 2017-12-27 MED ORDER — FINASTERIDE 5 MG PO TABS: 5.0000 mg | ORAL_TABLET | Freq: Every day | ORAL | 3 refills | Status: DC

## 2017-12-28 ENCOUNTER — Telehealth: Payer: Self-pay

## 2017-12-28 DIAGNOSIS — R7989 Other specified abnormal findings of blood chemistry: Secondary | ICD-10-CM

## 2017-12-28 LAB — CREATININE, SERUM
CREATININE: 1.33 mg/dL — AB (ref 0.76–1.27)
GFR calc Af Amer: 63 mL/min/{1.73_m2} (ref >59)
GFR, EST NON AFRICAN AMERICAN: 54 mL/min/{1.73_m2} — AB (ref >59)

## 2017-12-28 LAB — PSA: PROSTATE SPECIFIC AG, SERUM: 0.1 ng/mL (ref 0.0–4.0)

## 2017-12-28 NOTE — Telephone Encounter (Signed)
Spoke with pt in reference to lab results and needing a RUS. Pt voiced understanding.

## 2017-12-28 NOTE — Telephone Encounter (Signed)
-----   Message from Harle BattiestShannon A McGowan, PA-C sent at 12/28/2017  7:28 AM EST ----- Please let [name] know that his PSA is normal, but his creatinine is elevated.  We should get a renal ultrasound to make sure his kidneys are not being effected by his large PVR's.

## 2018-01-05 ENCOUNTER — Ambulatory Visit
Admission: RE | Admit: 2018-01-05 | Discharge: 2018-01-05 | Disposition: A | Discharge status: Home / Self Care | Payer: Medicare Other | Source: Ambulatory Visit | Attending: Urology | Admitting: Urology

## 2018-01-05 ENCOUNTER — Telehealth: Payer: Self-pay

## 2018-01-05 DIAGNOSIS — N329 Bladder disorder, unspecified: Secondary | ICD-10-CM | POA: Diagnosis not present

## 2018-01-05 DIAGNOSIS — R7989 Other specified abnormal findings of blood chemistry: Secondary | ICD-10-CM | POA: Diagnosis present

## 2018-01-05 DIAGNOSIS — R39198 Other difficulties with micturition: Secondary | ICD-10-CM | POA: Insufficient documentation

## 2018-01-05 NOTE — Telephone Encounter (Signed)
-----   Message from Harle BattiestShannon A McGowan, PA-C sent at 01/05/2018  3:28 PM EST ----- Please let [name] know that his RUS was normal.  He needs to follow up with his PCP regarding his kidney function.  It is worsening.

## 2018-01-05 NOTE — Telephone Encounter (Signed)
Spoke with pt in reference to RUS results and f/u with PCP for kidney function. Pt voiced understanding.

## 2018-01-25 ENCOUNTER — Encounter: Payer: Self-pay | Admitting: Emergency Medicine

## 2018-01-25 ENCOUNTER — Emergency Department
Admission: EM | Admit: 2018-01-25 | Discharge: 2018-01-25 | Disposition: A | Discharge status: Home / Self Care | Payer: Medicare Other | Attending: Emergency Medicine | Admitting: Emergency Medicine

## 2018-01-25 ENCOUNTER — Other Ambulatory Visit: Payer: Self-pay

## 2018-01-25 ENCOUNTER — Emergency Department: Payer: Medicare Other

## 2018-01-25 DIAGNOSIS — K859 Acute pancreatitis without necrosis or infection, unspecified: Secondary | ICD-10-CM | POA: Diagnosis not present

## 2018-01-25 DIAGNOSIS — Z7982 Long term (current) use of aspirin: Secondary | ICD-10-CM | POA: Insufficient documentation

## 2018-01-25 DIAGNOSIS — Z7984 Long term (current) use of oral hypoglycemic drugs: Secondary | ICD-10-CM | POA: Diagnosis not present

## 2018-01-25 DIAGNOSIS — Z79899 Other long term (current) drug therapy: Secondary | ICD-10-CM | POA: Diagnosis not present

## 2018-01-25 DIAGNOSIS — E119 Type 2 diabetes mellitus without complications: Secondary | ICD-10-CM | POA: Diagnosis not present

## 2018-01-25 DIAGNOSIS — Z87891 Personal history of nicotine dependence: Secondary | ICD-10-CM | POA: Diagnosis not present

## 2018-01-25 DIAGNOSIS — I10 Essential (primary) hypertension: Secondary | ICD-10-CM | POA: Diagnosis not present

## 2018-01-25 DIAGNOSIS — R1013 Epigastric pain: Secondary | ICD-10-CM | POA: Diagnosis present

## 2018-01-25 LAB — COMPREHENSIVE METABOLIC PANEL
ALT: 29 U/L (ref 17–63)
AST: 26 U/L (ref 15–41)
Albumin: 4.3 g/dL (ref 3.5–5.0)
Alkaline Phosphatase: 68 U/L (ref 38–126)
Anion gap: 3 — ABNORMAL LOW (ref 5–15)
BUN: 30 mg/dL — AB (ref 6–20)
CHLORIDE: 103 mmol/L (ref 101–111)
CO2: 30 mmol/L (ref 22–32)
CREATININE: 1.3 mg/dL — AB (ref 0.61–1.24)
Calcium: 9.6 mg/dL (ref 8.9–10.3)
GFR calc Af Amer: >60 mL/min (ref >60)
GFR calc non Af Amer: 54 mL/min — ABNORMAL LOW (ref >60)
Glucose, Bld: 126 mg/dL — ABNORMAL HIGH (ref 65–99)
Potassium: 4.4 mmol/L (ref 3.5–5.1)
SODIUM: 136 mmol/L (ref 135–145)
Total Bilirubin: 0.2 mg/dL — ABNORMAL LOW (ref 0.3–1.2)
Total Protein: 7.6 g/dL (ref 6.5–8.1)

## 2018-01-25 LAB — URINALYSIS, COMPLETE (UACMP) WITH MICROSCOPIC
BILIRUBIN URINE: NEGATIVE
Bacteria, UA: NONE SEEN
Glucose, UA: NEGATIVE mg/dL
HGB URINE DIPSTICK: NEGATIVE
KETONES UR: NEGATIVE mg/dL
LEUKOCYTES UA: NEGATIVE
NITRITE: NEGATIVE
Protein, ur: NEGATIVE mg/dL
Specific Gravity, Urine: 1.018 (ref 1.005–1.030)
pH: 6 (ref 5.0–8.0)

## 2018-01-25 LAB — CBC
HCT: 39.4 % — ABNORMAL LOW (ref 40.0–52.0)
Hemoglobin: 13.4 g/dL (ref 13.0–18.0)
MCH: 29.2 pg (ref 26.0–34.0)
MCHC: 33.9 g/dL (ref 32.0–36.0)
MCV: 86.1 fL (ref 80.0–100.0)
Platelets: 224 10*3/uL (ref 150–440)
RBC: 4.58 MIL/uL (ref 4.40–5.90)
RDW: 13.6 % (ref 11.5–14.5)
WBC: 8 10*3/uL (ref 3.8–10.6)

## 2018-01-25 LAB — LIPASE, BLOOD: LIPASE: 363 U/L — AB (ref 11–51)

## 2018-01-25 MED ORDER — SODIUM CHLORIDE 0.9 % IV SOLN
1000.0000 mL | Freq: Once | INTRAVENOUS | Status: AC
  Administered 2018-01-25: 1000 mL via INTRAVENOUS

## 2018-01-25 MED ORDER — HYDROCODONE-ACETAMINOPHEN 5-325 MG PO TABS: 1.0000 | ORAL_TABLET | Freq: Four times a day (QID) | ORAL | 0 refills | Status: DC | PRN

## 2018-01-25 MED ORDER — IOPAMIDOL (ISOVUE-300) INJECTION 61%
100.0000 mL | Freq: Once | INTRAVENOUS | Status: AC | PRN
  Administered 2018-01-25: 100 mL via INTRAVENOUS

## 2018-01-25 NOTE — ED Notes (Signed)
AAOx3.  Skin warm and dry.  NAD 

## 2018-01-25 NOTE — ED Notes (Signed)
After pt had juice around 7pm yesterday his stomach was hurting. He thought maybe he needed something to eat so he had dinner and it continued to hurt. He had a similar episode last week after eating and it resolved on its own. He still rates his abd pain 4/10

## 2018-01-25 NOTE — ED Notes (Signed)
Patient transported to CT 

## 2018-01-25 NOTE — ED Provider Notes (Signed)
[facility] Emergency Department Provider Note   ____________________________________________    I have reviewed the triage vital signs and the nursing notes.   HISTORY  Chief Complaint Abdominal Pain     HPI Vincent Bautista is a 72 y.o. male who presents with complaints of abdominal pain. Patient describes a burning abdominal pain in his epigastrium which started yesterday after he drank some cranberry juice. He notes that he feels better now and pain has resolved. No nausea or vomiting. Did not take anything for the pain. No fevers/chills. No sick contacts. No new medications. Not a drinker.    Past Medical History:  Diagnosis Date  . Bilateral kidney stones   . BPH (benign prostatic hypertrophy)   . Diabetes mellitus, type 2 (HCC)   . Hematuria    gross  . HTN (hypertension)   . Urinary retention     There are no active problems to display for this patient.   Past Surgical History:  Procedure Laterality Date  . none      Prior to Admission medications   Medication Sig Start Date End Date Taking? Authorizing Provider  aspirin 81 MG tablet Take 81 mg by mouth daily.   Yes [provider]  finasteride (PROSCAR) 5 MG tablet Take 1 tablet (5 mg total) by mouth daily. 12/27/17  Yes McGowan, Carollee HerterShannon A, PA-C  glipiZIDE (GLUCOTROL) 5 MG tablet Take 5 mg by mouth daily.  04/16/16  Yes [provider]  hydrochlorothiazide (HYDRODIURIL) 25 MG tablet Take 25 mg by mouth daily.  04/05/16  Yes [provider]  lisinopril (PRINIVIL,ZESTRIL) 10 MG tablet Take 10 mg by mouth daily.  04/16/16  Yes [provider]  simvastatin (ZOCOR) 20 MG tablet Take 20 mg by mouth daily.  04/05/16  Yes [provider]  tamsulosin (FLOMAX) 0.4 MG CAPS capsule Take 1 capsule (0.4 mg total) by mouth daily. 12/27/17  Yes McGowan, Carollee HerterShannon A, PA-C  HYDROcodone-acetaminophen (NORCO/VICODIN) 5-325 MG tablet Take 1 tablet by mouth every 6 (six)  hours as needed for moderate pain. 01/25/18   Jene EveryKinner, Jenniffer Vessels, MD  ibuprofen (ADVIL,MOTRIN) 600 MG tablet Take 1 tablet (600 mg total) by mouth every 8 (eight) hours as needed. 12/12/16   Governor RooksLord, Rebecca, MD     Allergies Codeine  Family History  Problem Relation Age of Onset  . Colon cancer Brother   . Cancer Unknown   . Prostate cancer Brother   . Lung cancer Father   . Kidney disease Neg Hx   . Bladder Cancer Neg Hx     Social History Social History   Tobacco Use  . Smoking status: Former Games developermoker  . Smokeless tobacco: Never Used  . Tobacco comment: quit 30 years  Substance Use Topics  . Alcohol use: No    Alcohol/week: 0.0 oz  . Drug use: No    Review of Systems  Constitutional: No fever/chills Eyes: No visual changes.  ENT: No sore throat. Cardiovascular: Denies chest pain. Respiratory: Denies shortness of breath. Gastrointestinal: Abdominal pain as above Genitourinary: Negative for dysuria. Musculoskeletal: Negative for back pain. Skin: Negative for rash. Neurological: Negative for headaches   ____________________________________________   PHYSICAL EXAM:  VITAL SIGNS: ED Triage Vitals  Enc Vitals Group     BP 01/25/18 0333 (!) 152/78     Pulse Rate 01/25/18 0333 79     Resp 01/25/18 0333 20     Temp 01/25/18 0333 97.8 F (36.6 C)     Temp  Source 01/25/18 0333 Oral     SpO2 01/25/18 0333 100 %     Weight 01/25/18 0334 71.7 kg (158 lb)     Height 01/25/18 0334 1.626 m (5\' 4" )     Head Circumference --      Peak Flow --      Pain Score 01/25/18 0334 6     Pain Loc --      Pain Edu? --      Excl. in GC? --     Constitutional: Alert and oriented. No acute distress. Pleasant and interactive Eyes: Conjunctivae are normal.   Nose: No congestion/rhinnorhea. Mouth/Throat: Mucous membranes are moist.    Cardiovascular: Normal rate, regular rhythm. Grossly normal heart sounds.  Good peripheral circulation. Respiratory: Normal respiratory effort.  No  retractions. Lungs CTAB. Gastrointestinal: Soft and nontender. No distention.  No CVA tenderness. Genitourinary: deferred Musculoskeletal:  Warm and well perfused Neurologic:  Normal speech and language. No gross focal neurologic deficits are appreciated.  Skin:  Skin is warm, dry and intact. No rash noted. Psychiatric: Mood and affect are normal. Speech and behavior are normal.  ____________________________________________   LABS (all labs ordered are listed, but only abnormal results are displayed)  Labs Reviewed  LIPASE, BLOOD - Abnormal; Notable for the following components:      Result Value   Lipase 363 (*)    All other components within normal limits  COMPREHENSIVE METABOLIC PANEL - Abnormal; Notable for the following components:   Glucose, Bld 126 (*)    BUN 30 (*)    Creatinine, Ser 1.30 (*)    Total Bilirubin 0.2 (*)    GFR calc non Af Amer 54 (*)    Anion gap 3 (*)    All other components within normal limits  CBC - Abnormal; Notable for the following components:   HCT 39.4 (*)    All other components within normal limits  URINALYSIS, COMPLETE (UACMP) WITH MICROSCOPIC - Abnormal; Notable for the following components:   Color, Urine YELLOW (*)    APPearance CLEAR (*)    Squamous Epithelial / LPF 0-5 (*)    All other components within normal limits  TROPONIN I   ____________________________________________  EKG  ED ECG REPORT I, Jene Every, the attending physician, personally viewed and interpreted this ECG.  Date: 01/25/2018  Rate: 64 Rhythm: normal sinus rhythm QRS Axis: normal Intervals: normal ST/T Wave abnormalities: nonspecific changes, PVCs   ____________________________________________  RADIOLOGY  CT abdomen pelvis ____________________________________________   PROCEDURES  Procedure(s) performed: No  Procedures   Critical Care performed: No ____________________________________________   INITIAL IMPRESSION / ASSESSMENT AND PLAN  / ED COURSE  Pertinent labs & imaging results that were available during my care of the patient were reviewed by me and considered in my medical decision making (see chart for details).  Patient presents with epigastric burning pain, now resolved, exam is reassuring.  Lab work significant for elevated lipase which suggest pancreatitis.  However given that his symptoms have resolved we will obtain CT imaging to ensure no pancreatic mass  CT imaging confirms pancreatitis.  Discussed results with patient and recommended admission the patient would like to try to go home on p.o. medications and liquid diet, he states he will return if any worsening of his symptoms.    ____________________________________________   FINAL CLINICAL IMPRESSION(S) / ED DIAGNOSES  Final diagnoses:  Acute pancreatitis, unspecified complication status, unspecified pancreatitis type        Note:  This document was prepared  using Conservation officer, historic buildings and may include unintentional dictation errors.    Jene Every, MD 01/25/18 [phone]

## 2018-01-25 NOTE — ED Notes (Signed)
Return from CT scan.  AAOx3.  Skin warm and dry.  

## 2018-01-25 NOTE — ED Triage Notes (Signed)
Pt presents to ED with c/o intermittent mid abd cramping. Pt states around 1900 last night he drank some cranberry juice and his pain started immediately after that. Pt denies nausea. Last bowel movement was this morning and was "normal". Pt states pain increases when he is lying down but feels better when he is moving around. Has a hx of the same a couple of weeks ago but symptoms resolved on its own.

## 2018-01-25 NOTE — Discharge Instructions (Signed)
As we discussed your pancreas is inflamed. Please stay on the diet we discussed and take pain medication as directed. Return to the hospital immediately if your pain worsens.

## 2018-06-27 ENCOUNTER — Other Ambulatory Visit: Payer: Medicare Other

## 2018-06-27 ENCOUNTER — Encounter: Payer: Self-pay | Admitting: Urology

## 2018-07-03 NOTE — Progress Notes (Signed)
10:03 AM   Vincent Bautista 1946-08-13 [id number]  Referring provider: Letta Median, MD [place], [place] [id number]  Chief Complaint  Patient presents with  . Benign Prostatic Hypertrophy    HPI: Patient is a 72 year old [place] American male who presents today for a 6 month follow up for BPH with urinary retention.   History of urinary retention Patient was found to be in urinary retention after suffering a TIA in December 2015. Patient was initiated on finasteride and Flomax at that time.  He did undergo cystoscopy in June 2015 and was found to have an enlarged prostate, a bladder diverticulum with hypospadias.  He continues to have large residuals and has failed two voiding trials.  He does not have a good understanding of his situation as he continually asks for fluids pills to help him urinate.  His most recent serum creatinine was 1.3 mg/dL in 01/2018 with > 60 eGFR.  Contrast CT in 01/2018 noted no adrenal lesions are appreciable. Kidneys bilaterally show no evident mass or hydronephrosis on either side.  There is a 1 mm calculus in the lower pole of the left kidney. No other intrarenal calculi are evident. There is no ureteral calculus on either side. Urinary bladder wall is not appreciably thickened.  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   BPH WITH LUTS Today's I PSS score is 13/2.   His PVR today is 514 mL.  His previous IPSS was 13/2.  His previous PVR score was 508 mL.  He denies any dysuria, hematuria or suprapubic pain.  He is currently taking tamsulosin 0.4 mg daily and finasteride 5 mg daily.  He also denies any recent fevers, chills, nausea or vomiting.  He has a family history of prostate cancer in his brother.  He was wondering if a medication that he has seen on the TV will help him void better.  I advised against purchasing the product.  IPSS    Row Name 07/04/18 0900          International Prostate Symptom Score   How often have you had the sensation of not emptying your bladder?  Less than half the time     How often have you had to urinate less than every two hours?  About half the time     How often have you found you stopped and started again several times when you urinated?  Less than half the time     How often have you found it difficult to postpone urination?  About half the time     How often have you had a weak urinary stream?  Less than half the time     How often have you had to strain to start urination?  Not at All     How many times did you typically get up at night to urinate?  1 Time     Total IPSS Score  13       Quality of Life due to urinary symptoms   If you were to spend the rest of your life with your urinary condition just the way it is now how would you feel about that?  Mostly Satisfied        Score:  1-7 Mild 8-19 Moderate 20-35 Severe  Microscopic hematuria Prior hematuria work up with CTU and cystoscopy noted bilateral punctate stones, enlarged prostate, a bladder diverticulum with hypospadias.  0-5 RBC's on UA in 01/2018 during an ED visit.  No reports of gross hematuria.  Former smoker.    PMH: Past Medical History:  Diagnosis Date  . Bilateral kidney stones   . BPH (benign prostatic hypertrophy)   . Diabetes mellitus, type 2 ([place])   . Hematuria    gross  . HTN (hypertension)   . Urinary retention     Surgical History: Past Surgical History:  Procedure Laterality Date  . none      Home Medications:  Allergies as of 07/04/2018      Reactions   Codeine Nausea And Vomiting      Medication List        Accurate as of 07/04/18 10:03 AM. Always use your most recent med list.          aspirin 81 MG tablet Take 81 mg by mouth daily.   finasteride 5 MG tablet Commonly known as:  PROSCAR Take 1 tablet (5 mg total) by mouth daily.   glipiZIDE 5 MG tablet Commonly known as:  GLUCOTROL Take 5 mg by mouth  daily.   hydrochlorothiazide 25 MG tablet Commonly known as:  HYDRODIURIL Take 25 mg by mouth daily.   HYDROcodone-acetaminophen 5-325 MG tablet Commonly known as:  NORCO/VICODIN Take 1 tablet by mouth every 6 (six) hours as needed for moderate pain.   ibuprofen 600 MG tablet Commonly known as:  ADVIL,MOTRIN Take 1 tablet (600 mg total) by mouth every 8 (eight) hours as needed.   lisinopril 10 MG tablet Commonly known as:  PRINIVIL,ZESTRIL Take 10 mg by mouth daily.   simvastatin 20 MG tablet Commonly known as:  ZOCOR Take 20 mg by mouth daily.   tamsulosin 0.4 MG Caps capsule Commonly known as:  FLOMAX Take 1 capsule (0.4 mg total) by mouth daily.       Allergies:  Allergies  Allergen Reactions  . Codeine Nausea And Vomiting    Family History: Family History  Problem Relation Age of Onset  . Colon cancer Brother   . Cancer Unknown   . Prostate cancer Brother   . Lung cancer Father   . Kidney disease Neg Hx   . Bladder Cancer Neg Hx     Social History:  reports that he has quit smoking. He has never used smokeless tobacco. He reports that he does not drink alcohol or use drugs.  ROS: UROLOGY Frequent Urination?: No Hard to postpone urination?: No Burning/pain with urination?: No Get up at night to urinate?: No Leakage of urine?: No Urine stream starts and stops?: No Trouble starting stream?: No Do you have to strain to urinate?: No Blood in urine?: No Urinary tract infection?: No Sexually transmitted disease?: No Injury to kidneys or bladder?: No Painful intercourse?: No Weak stream?: No Erection problems?: No Penile pain?: No  Gastrointestinal Nausea?: No Vomiting?: No Indigestion/heartburn?: No Diarrhea?: No Constipation?: No  Constitutional Fever: No Night sweats?: No Weight loss?: No Fatigue?: No  Skin Skin rash/lesions?: No Itching?: No  Eyes Blurred vision?: No Double vision?: No  Ears/Nose/Throat Sore throat?: No Sinus  problems?: No  Hematologic/Lymphatic Swollen glands?: No Easy bruising?: No  Cardiovascular Leg swelling?: No Chest pain?: No  Respiratory Cough?: No Shortness of breath?: No  Endocrine Excessive thirst?: No  Musculoskeletal Back pain?: No Joint pain?: No  Neurological Headaches?: No Dizziness?: No  Psychologic Depression?: No Anxiety?: No  Physical Exam: BP 115/66   Pulse 71   Ht '5\' 4"'$  (1.626 m)   Wt 140  lb (63.5 kg)   BMI 24.03 kg/m   Constitutional: Well nourished. Alert and oriented, No acute distress. HEENT:  AT, moist mucus membranes. Trachea midline, no masses. Cardiovascular: No clubbing, cyanosis, or edema. Respiratory: Normal respiratory effort, no increased work of breathing. GI: Abdomen is soft, non tender, non distended, no abdominal masses. Liver and spleen not palpable.  No hernias appreciated.  Stool sample for occult testing is not indicated.   GU: No CVA tenderness.  No bladder fullness or masses.  Patient with uncircumcised phallus. Foreskin easily retracted Urethral meatus is patent. Coronal hypospadias.  No penile discharge. No penile lesions or rashes. Scrotum without lesions, cysts, rashes and/or edema.  Testicles are located scrotally bilaterally. No masses are appreciated in the testicles. Left and right epididymis are normal. Rectal: Patient with  normal sphincter tone. Anus and perineum without scarring or rashes. No rectal masses are appreciated. Prostate is approximately 45 grams, no nodules are appreciated. Seminal vesicles are normal. Skin: No rashes, bruises or suspicious lesions. Lymph: No cervical or inguinal adenopathy. Neurologic: Grossly intact, no focal deficits, moving all 4 extremities. Psychiatric: Normal mood and affect.    Laboratory Data: PSA Trend  3.1 in 11/2014 - started on finasteride  0.2 (0.4) in 05/2016  0.1 (0.2) in 12/2016  0.2 (0.4) in 06/2017  0.1 (0.2) in 12/2017 Lab Results  Component Value Date    CREATININE 1.30 (H) 01/25/2018   I have reviewed the labs.    Pertinent imaging CLINICAL DATA:  Abdominal pain and elevated lipase  EXAM: CT ABDOMEN AND PELVIS WITH CONTRAST  TECHNIQUE: Multidetector CT imaging of the abdomen and pelvis was performed using the standard protocol following bolus administration of intravenous contrast.  CONTRAST:  163m ISOVUE-300 IOPAMIDOL (ISOVUE-300) INJECTION 61%  COMPARISON:  March 14, 2014  FINDINGS: Lower chest: There is posterior bibasilar atelectatic change. Lung bases otherwise are clear. There is a small hiatal hernia.  Hepatobiliary: There is a small calcified granuloma in the anterior segment of the right lobe of the liver. No other focal liver lesions are evident. Gallbladder wall is not appreciably thickened. There is no biliary duct dilatation.  Pancreas: There is mesenteric stranding adjacent to the pancreas anteriorly and somewhat more pronounced inferior to the pancreas. This inflammatory change abuts the third portion of the duodenum and extends slightly inferior to the third portion of the duodenum into the mid abdominal mesentery. There is mild irregularity along the inferior aspect of the uncinate process due to the adjacent inflammation. There is no pancreatic mass or pseudocyst. No pancreatic duct dilatation. The pancreas itself does not appear edematous except for along the posterior aspect of the uncinate process. There is no fluid collection in the peripancreatic region.  Spleen: No splenic lesions are evident.  Adrenals/Urinary Tract: No adrenal lesions are appreciable. Kidneys bilaterally show no evident mass or hydronephrosis on either side. There is a 1 mm calculus in the lower pole of the left kidney. No other intrarenal calculi are evident. There is no ureteral calculus on either side. Urinary bladder wall is not appreciably thickened. However, there is evidence of a posterior urinary  bladder diverticulum, stable from the prior study.  Stomach/Bowel: As noted above, inflammatory change in the mesentery abuts the third portion of the duodenum anteriorly. The duodenal wall does not appear appreciably thickened. Elsewhere, there is no bowel wall or mesenteric thickening. No evident bowel obstruction. No free air or portal venous air.  Vascular/Lymphatic: There is atherosclerotic calcification and mild plaque in the abdominal  aorta. No aneurysm evident. There is focal calcification in both proximal renal arteries with what appears to be greater than 50% diameter stenosis in each proximal renal artery. There is no major mesenteric arterial vascular obstruction. There is no evident adenopathy in the abdomen or pelvis.  Reproductive: Prostate and seminal vesicles are normal in size and contour. No pelvic mass evident.  Other: There is fat in each inguinal ring. Appendix appears unremarkable. There is no ascites or abscess in the abdomen or pelvis. There is a small ventral hernia. Bowel extends into the proximal most aspect of this hernia, but there is no evident bowel compromise.  Musculoskeletal: There is degenerative change in the lumbar spine. There are no blastic or lytic bone lesions. There is no intramuscular or abdominal wall lesion.  IMPRESSION: 1. There is a degree of acute pancreatitis with mesenteric thickening surrounding portions of the pancreas, particularly inferiorly and posteriorly. There is mild edema involving portions the uncinate process. No other pancreatic edema identified. No pancreatic mass or pseudocyst. No pancreatic duct dilatation.  2. Inflammatory change felt to arise from the pancreas abuts the anterior aspect of the third portion of the duodenum. There may be a degree of duodenitis from this adjacent inflammation. There is no overt thickening of the small bowel wall in this area, however.  3. Elsewhere no bowel wall  thickening. No bowel obstruction. No abscess in the abdomen or pelvis. No ascites. Appendix appears normal.  4. 1 mm nonobstructing calculus lower pole left kidney. No hydronephrosis or ureteral calculus on either side.  5. 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 No urinary bladder wall thickening evident.  6. Aortic atherosclerosis. Focal calcification in each proximal renal artery with greater than 50% diameter stenosis in each of these areas. Question renovascular basis for hypertension.  7. Small hiatal hernia. Small ventral hernia which has bowel extending into its proximal aspect without bowel compromise.  Aortic Atherosclerosis (ICD10-I70.0).   Electronically Signed   By: Lowella Grip III M.D.   On: 01/25/2018 08:03  Results for HILLIS, MCPHATTER (MRN [id number]) as of 07/04/2018 09:51  Ref. Range 07/04/2018 09:41  Scan Result Unknown 514 ml    I have independently reviewed the films  Assessment & Plan:    1. History of urinary retention Creatinine checked today, CTU pending Continue tamsulosin 0.4 mg and finasteride 5 mg daily Advised to contact the office or seek treatment in the emergency department if he is unable to void  2. BPH (benign prostatic hyperplasia) with urinary retention:   IPSS score is 13/2, it is stable Continue conservative management, avoiding bladder irritants and timed voiding's Continue tamsulosin 0.4 mg daily and finasteride 5 mg daily; refills given RTC in 6 months for I PSS, PSA, PVR, creatinine and exam - if PSA is stable  3. Bladder diverticulum  - may be contributing the large residuals  4 Microscopic hematuria I explained to the patient that there are a number of causes that can be associated with blood in the urine, such as stones, BPH, UTI's, damage to the urinary tract and/or cancer. At this time, I felt that the patient warranted further urologic evaluation with 3 or greater RBC's/hpf on  microscopic evaluation of the urine.  The AUA guidelines state that a CT urogram is the preferred imaging study to evaluate hematuria. I explained to the patient that a contrast material will be injected into a vein and that in rare instances, an allergic reaction can result and may even life  threatening   The patient denies any allergies to contrast, iodine and/or seafood and is not taking metformin Following the imaging study,  I've recommended a cystoscopy. I described how this is performed, typically in an office setting with a flexible cystoscope. We described the risks, benefits, and possible side effects, the most common of which is a minor amount of blood in the urine and/or burning which usually resolves in 24 to 48 hours.   The patient had the opportunity to ask questions which were answered. Based upon this discussion, the patient is willing to proceed. Therefore, I've ordered: a CT Urogram and cystoscopy. The patient will return following all of the above for discussion of the results.  UA is negative for hematuria Urine culture pending BUN + creatinine pending    Return for CT Urogram report and cystoscopy.  These notes generated with voice recognition software. I apologize for typographical errors.  Zara Council, PA-C  [facility] [address] [place], [place] [address] [phone]

## 2018-07-04 ENCOUNTER — Encounter: Payer: Self-pay | Admitting: Urology

## 2018-07-04 ENCOUNTER — Ambulatory Visit (INDEPENDENT_AMBULATORY_CARE_PROVIDER_SITE_OTHER): Payer: Medicare Other | Admitting: Urology

## 2018-07-04 VITALS — BP 115/66 | HR 71 | Ht 64.0 in | Wt 140.0 lb

## 2018-07-04 DIAGNOSIS — N138 Other obstructive and reflux uropathy: Secondary | ICD-10-CM

## 2018-07-04 DIAGNOSIS — R7989 Other specified abnormal findings of blood chemistry: Secondary | ICD-10-CM

## 2018-07-04 DIAGNOSIS — R3129 Other microscopic hematuria: Secondary | ICD-10-CM

## 2018-07-04 DIAGNOSIS — N529 Male erectile dysfunction, unspecified: Secondary | ICD-10-CM | POA: Diagnosis not present

## 2018-07-04 DIAGNOSIS — N323 Diverticulum of bladder: Secondary | ICD-10-CM

## 2018-07-04 DIAGNOSIS — N401 Enlarged prostate with lower urinary tract symptoms: Secondary | ICD-10-CM

## 2018-07-04 DIAGNOSIS — R339 Retention of urine, unspecified: Secondary | ICD-10-CM

## 2018-07-04 LAB — URINALYSIS, COMPLETE
Bilirubin, UA: NEGATIVE
Glucose, UA: NEGATIVE
Ketones, UA: NEGATIVE
Leukocytes, UA: NEGATIVE
NITRITE UA: NEGATIVE
PH UA: 5.5 (ref 5.0–7.5)
Protein, UA: NEGATIVE
RBC, UA: NEGATIVE
Specific Gravity, UA: 1.02 (ref 1.005–1.030)
UUROB: 0.2 mg/dL (ref 0.2–1.0)

## 2018-07-04 LAB — MICROSCOPIC EXAMINATION: RBC MICROSCOPIC, UA: NONE SEEN /HPF (ref 0–2)

## 2018-07-04 LAB — BLADDER SCAN AMB NON-IMAGING

## 2018-07-04 MED ORDER — TAMSULOSIN HCL 0.4 MG PO CAPS: 0.4000 mg | ORAL_CAPSULE | Freq: Every day | ORAL | 3 refills | Status: DC

## 2018-07-04 MED ORDER — FINASTERIDE 5 MG PO TABS: 5.0000 mg | ORAL_TABLET | Freq: Every day | ORAL | 3 refills | Status: DC

## 2018-07-04 NOTE — Patient Instructions (Signed)
Hematuria, Adult Hematuria is blood in your urine. It can be caused by a bladder infection, kidney infection, prostate infection, kidney stone, or cancer of your urinary tract. Infections can usually be treated with medicine, and a kidney stone usually will pass through your urine. If neither of these is the cause of your hematuria, further workup to find out the reason may be needed. It is very important that you tell your health care provider about any blood you see in your urine, even if the blood stops without treatment or happens without causing pain. Blood in your urine that happens and then stops and then happens again can be a symptom of a very serious condition. Also, pain is not a symptom in the initial stages of many urinary cancers. Follow these instructions at home:  Drink lots of fluid, 3-4 quarts a day. If you have been diagnosed with an infection, cranberry juice is especially recommended, in addition to large amounts of water.  Avoid caffeine, tea, and carbonated beverages because they tend to irritate the bladder.  Avoid alcohol because it may irritate the prostate.  Take all medicines as directed by your health care provider.  If you were prescribed an antibiotic medicine, finish it all even if you start to feel better.  If you have been diagnosed with a kidney stone, follow your health care provider's instructions regarding straining your urine to catch the stone.  Empty your bladder often. Avoid holding urine for long periods of time.  After a bowel movement, women should cleanse front to back. Use each tissue only once.  Empty your bladder before and after sexual intercourse if you are a male. Contact a health care provider if:  You develop back pain.  You have a fever.  You have a feeling of sickness in your stomach (nausea) or vomiting.  Your symptoms are not better in 3 days. Return sooner if you are getting worse. Get help right away if:  You develop  severe vomiting and are unable to keep the medicine down.  You develop severe back or abdominal pain despite taking your medicines.  You begin passing a large amount of blood or clots in your urine.  You feel extremely weak or faint, or you pass out. This information is not intended to replace advice given to you by your health care provider. Make sure you discuss any questions you have with your health care provider. Document Released: 12/07/2005 Document Revised: 05/14/2016 Document Reviewed: 08/07/2013 Elsevier Interactive Patient Education  2017 [employer]  CT Scan A CT scan (computed tomography scan) is an imaging scan. It uses X-rays and a computer to make detailed pictures of different areas inside the body. A CT scan can give more information than a regular X-ray exam. A CT scan provides data about internal organs, soft tissue structures, blood vessels, and bones. In this procedure, the pictures will be taken in a large machine that has an opening (CT scanner). Tell a health care provider about:  Any allergies you have.  All medicines you are taking, including vitamins, herbs, eye drops, creams, and over-the-counter medicines.  Any blood disorders you have.  Any surgeries you have had.  Any medical conditions you have.  Whether you are pregnant or may be pregnant. What are the risks? Generally, this is a safe procedure. However, problems may occur, including:  An allergic reaction to dyes.  Development of cancer from excessive exposure to radiation from multiple CT scans. This is rare.  What happens before   the procedure? Staying hydrated Follow instructions from your health care provider about hydration, which may include:  Up to 2 hours before the procedure - you may continue to drink clear liquids, such as water, clear fruit juice, black coffee, and plain tea.  Eating and drinking restrictions Follow instructions from your health care provider about eating and  drinking, which may include:  24 hours before the procedure - stop drinking caffeinated beverages, such as energy drinks, tea, soda, coffee, and hot chocolate.  8 hours before the procedure - stop eating heavy meals or foods such as meat, fried foods, or fatty foods.  6 hours before the procedure - stop eating light meals or foods, such as toast or cereal.  6 hours before the procedure - stop drinking milk or drinks that contain milk.  2 hours before the procedure - stop drinking clear liquids.  General instructions  Remove any jewelry.  Ask your health care provider about changing or stopping your regular medicines. This is especially important if you are taking diabetes medicines or blood thinners. What happens during the procedure?  You will lie on a table with your arms above your head.  An IV tube may be inserted into one of your veins.  The contrast dye may be injected into the IV tube. You may feel warm or have a metallic taste in your mouth.  The table you will be lying on will move into the CT scanner.  You will be able to see, hear, and talk to the person running the machine while you are in it. Follow that person's instructions.  The CT scanner will move around you to take pictures. Do not move while it is scanning. Staying still helps the scanner to get a good image.  When the best possible pictures have been taken, the machine will be turned off. The table will be moved out of the machine.  The IV tube will be removed. The procedure may vary among health care providers and hospitals. What happens after the procedure?  It is up to you to get the results of your procedure. Ask your health care provider, or the department that is doing the procedure, when your results will be ready. Summary  A CT scan is an imaging scan.  A CT scan uses X-rays and a computer to make detailed pictures of different areas of your body.  Follow instructions from your health care  provider about eating and drinking before the procedure.  You will be able to see, hear, and talk to the person running the machine while you are in it. Follow that person's instructions. This information is not intended to replace advice given to you by your health care provider. Make sure you discuss any questions you have with your health care provider. Document Released: 01/14/2005 Document Revised: 01/09/2017 Document Reviewed: 01/09/2017 Elsevier Interactive Patient Education  2018 [employer].  Cystoscopy Cystoscopy is a procedure that is used to help diagnose and sometimes treat conditions that affect that lower urinary tract. The lower urinary tract includes the bladder and the tube that drains urine from the bladder out of the body (urethra). Cystoscopy is performed with a thin, tube-shaped instrument with a light and camera at the end (cystoscope). The cystoscope may be hard (rigid) or flexible, depending on the goal of the procedure.The cystoscope is inserted through the urethra, into the bladder. Cystoscopy may be recommended if you have:  Urinary tractinfections that keep coming back (recurring).  Blood in the urine (hematuria).    Loss of bladder control (urinary incontinence) or an overactive bladder.  Unusual cells found in a urine sample.  A blockage in the urethra.  Painful urination.  An abnormality in the bladder found during an intravenous pyelogram (IVP) or CT scan.  Cystoscopy may also be done to remove a sample of tissue to be examined under a microscope (biopsy). Tell a health care provider about:  Any allergies you have.  All medicines you are taking, including vitamins, herbs, eye drops, creams, and over-the-counter medicines.  Any problems you or family members have had with anesthetic medicines.  Any blood disorders you have.  Any surgeries you have had.  Any medical conditions you have.  Whether you are pregnant or may be pregnant. What are the  risks? Generally, this is a safe procedure. However, problems may occur, including:  Infection.  Bleeding.  Allergic reactions to medicines.  Damage to other structures or organs.  What happens before the procedure?  Ask your health care provider about: ? Changing or stopping your regular medicines. This is especially important if you are taking diabetes medicines or blood thinners. ? Taking medicines such as aspirin and ibuprofen. These medicines can thin your blood. Do not take these medicines before your procedure if your health care provider instructs you not to.  Follow instructions from your health care provider about eating or drinking restrictions.  You may be given antibiotic medicine to help prevent infection.  You may have an exam or testing, such as X-rays of the bladder, urethra, or kidneys.  You may have urine tests to check for signs of infection.  Plan to have someone take you home after the procedure. What happens during the procedure?  To reduce your risk of infection,your health care team will wash or sanitize their hands.  You will be given one or more of the following: ? A medicine to help you relax (sedative). ? A medicine to numb the area (local anesthetic).  The area around the opening of your urethra will be cleaned.  The cystoscope will be passed through your urethra into your bladder.  Germ-free (sterile)fluid will flow through the cystoscope to fill your bladder. The fluid will stretch your bladder so that your surgeon can clearly examine your bladder walls.  The cystoscope will be removed and your bladder will be emptied. The procedure may vary among health care providers and hospitals. What happens after the procedure?  You may have some soreness or pain in your abdomen and urethra. Medicines will be available to help you.  You may have some blood in your urine.  Do not drive for 24 hours if you received a sedative. This information is  not intended to replace advice given to you by your health care provider. Make sure you discuss any questions you have with your health care provider. Document Released: 12/04/2000 Document Revised: 04/16/2016 Document Reviewed: 10/24/2015 Elsevier Interactive Patient Education  2018 Elsevier Inc.  

## 2018-07-05 ENCOUNTER — Telehealth: Payer: Self-pay

## 2018-07-05 LAB — BUN+CREAT
BUN/Creatinine Ratio: 19 (ref 10–24)
BUN: 26 mg/dL (ref 8–27)
CREATININE: 1.4 mg/dL — AB (ref 0.76–1.27)
GFR calc non Af Amer: 51 mL/min/{1.73_m2} — ABNORMAL LOW (ref >59)
GFR, EST AFRICAN AMERICAN: 58 mL/min/{1.73_m2} — AB (ref >59)

## 2018-07-05 LAB — PSA: PROSTATE SPECIFIC AG, SERUM: 0.1 ng/mL (ref 0.0–4.0)

## 2018-07-05 NOTE — Telephone Encounter (Signed)
-----   Message from Shannon A McGowan, PA-C sent at 07/05/2018  7:20 AM EDT ----- Please let Mr. Lapierre know that his PSA is stable at 0.1. 

## 2018-07-05 NOTE — Telephone Encounter (Signed)
Informed patient of results and recommendations. 

## 2018-07-05 NOTE — Telephone Encounter (Signed)
Patient notified

## 2018-07-05 NOTE — Telephone Encounter (Signed)
-----   Message from Harle BattiestShannon A McGowan, PA-C sent at 07/05/2018  7:20 AM EDT ----- Please let Mr. Vincent Bautista know that his PSA is stable at 0.1.

## 2018-07-06 LAB — CULTURE, URINE COMPREHENSIVE

## 2018-07-18 ENCOUNTER — Ambulatory Visit
Admission: RE | Admit: 2018-07-18 | Discharge: 2018-07-18 | Disposition: A | Discharge status: Home / Self Care | Payer: Medicare Other | Source: Ambulatory Visit | Attending: Urology | Admitting: Urology

## 2018-07-18 DIAGNOSIS — N200 Calculus of kidney: Secondary | ICD-10-CM | POA: Diagnosis not present

## 2018-07-18 DIAGNOSIS — I700 Atherosclerosis of aorta: Secondary | ICD-10-CM | POA: Diagnosis not present

## 2018-07-18 DIAGNOSIS — R3129 Other microscopic hematuria: Secondary | ICD-10-CM | POA: Insufficient documentation

## 2018-07-18 DIAGNOSIS — N323 Diverticulum of bladder: Secondary | ICD-10-CM | POA: Diagnosis not present

## 2018-07-18 MED ORDER — IOPAMIDOL (ISOVUE-300) INJECTION 61%
100.0000 mL | Freq: Once | INTRAVENOUS | Status: AC | PRN
  Administered 2018-07-18: 100 mL via INTRAVENOUS

## 2018-08-11 ENCOUNTER — Encounter: Payer: Self-pay | Admitting: Urology

## 2018-08-11 ENCOUNTER — Ambulatory Visit (INDEPENDENT_AMBULATORY_CARE_PROVIDER_SITE_OTHER): Payer: Medicare Other | Admitting: Urology

## 2018-08-11 VITALS — BP 116/64 | HR 71 | Ht 64.0 in | Wt 141.0 lb

## 2018-08-11 DIAGNOSIS — R3129 Other microscopic hematuria: Secondary | ICD-10-CM | POA: Diagnosis not present

## 2018-08-11 DIAGNOSIS — N401 Enlarged prostate with lower urinary tract symptoms: Secondary | ICD-10-CM

## 2018-08-11 DIAGNOSIS — N138 Other obstructive and reflux uropathy: Secondary | ICD-10-CM

## 2018-08-11 LAB — URINALYSIS, COMPLETE
Bilirubin, UA: NEGATIVE
Glucose, UA: NEGATIVE
Ketones, UA: NEGATIVE
Nitrite, UA: NEGATIVE
PROTEIN UA: NEGATIVE
Specific Gravity, UA: 1.02 (ref 1.005–1.030)
Urobilinogen, Ur: 0.2 mg/dL (ref 0.2–1.0)
pH, UA: 6 (ref 5.0–7.5)

## 2018-08-11 LAB — MICROSCOPIC EXAMINATION: WBC, UA: >30 /hpf — ABNORMAL HIGH (ref 0–5)

## 2018-08-11 MED ORDER — CIPROFLOXACIN HCL 500 MG PO TABS
500.0000 mg | ORAL_TABLET | Freq: Once | ORAL | Status: AC
  Administered 2018-08-11: 500 mg via ORAL

## 2018-08-11 MED ORDER — LIDOCAINE HCL URETHRAL/MUCOSAL 2 % EX GEL
1.0000 "application " | Freq: Once | CUTANEOUS | Status: AC
  Administered 2018-08-11: 1 via URETHRAL

## 2018-08-11 NOTE — Progress Notes (Signed)
   08/11/18  CC:  Chief Complaint  Patient presents with  . Cysto    HPI: BPH, microhematuria.  See Shannon's note of 7/15.  CTU performed on 07/18/2018 showed tiny nonobstructing left renal calculi.  No hydronephrosis or masses noted.  Large capacity bladder measuring 760 mL.  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  Blood pressure 116/64, pulse 71, height 5\' 4"  (1.626 m), weight 141 lb (64 kg). NED. A&Ox3.   No respiratory distress   Abd soft, NT, ND Hypospadias with bilateral descended testicles  Cystoscopy Procedure Note  Patient identification was confirmed, informed consent was obtained, and patient was prepped using Betadine solution.  Lidocaine jelly was administered per urethral meatus.    Preoperative abx where received prior to procedure.     Pre-Procedure: - Inspection reveals a normal caliber ureteral meatus.  Procedure: The flexible cystoscope was introduced without difficulty -There appeared to be a moderately large volume of urine in the bladder on introduction of the cystoscope.  Approximately 240 mL of urine was aspirated. - No urethral strictures/lesions are present. - Minimal lateral lobe enlargement prostate; nonocclusive prostate - Normal bladder neck - Bilateral ureteral orifices identified - Bladder mucosa  reveals no ulcers, tumors, or lesions - No bladder stones -Moderate trabeculation; large posterior wall bladder diverticulum  Retroflexion shows no significant abnormalities   Post-Procedure: - Patient tolerated the procedure well  Assessment/ Plan: No concerning findings on CTU/cystoscopy.  Recommend follow-up with Carollee HerterShannon in 3 months for symptom reassessment and bladder scan for PVR.

## 2018-08-15 ENCOUNTER — Encounter: Payer: Self-pay | Admitting: Emergency Medicine

## 2018-08-15 ENCOUNTER — Emergency Department
Admission: EM | Admit: 2018-08-15 | Discharge: 2018-08-15 | Disposition: A | Discharge status: Home / Self Care | Payer: Medicare Other | Attending: Emergency Medicine | Admitting: Emergency Medicine

## 2018-08-15 DIAGNOSIS — E119 Type 2 diabetes mellitus without complications: Secondary | ICD-10-CM | POA: Diagnosis not present

## 2018-08-15 DIAGNOSIS — Z7982 Long term (current) use of aspirin: Secondary | ICD-10-CM | POA: Diagnosis not present

## 2018-08-15 DIAGNOSIS — K2900 Acute gastritis without bleeding: Secondary | ICD-10-CM | POA: Diagnosis not present

## 2018-08-15 DIAGNOSIS — K861 Other chronic pancreatitis: Secondary | ICD-10-CM | POA: Diagnosis not present

## 2018-08-15 DIAGNOSIS — Z79899 Other long term (current) drug therapy: Secondary | ICD-10-CM | POA: Insufficient documentation

## 2018-08-15 DIAGNOSIS — I10 Essential (primary) hypertension: Secondary | ICD-10-CM | POA: Diagnosis not present

## 2018-08-15 DIAGNOSIS — Z87891 Personal history of nicotine dependence: Secondary | ICD-10-CM | POA: Diagnosis not present

## 2018-08-15 DIAGNOSIS — R1033 Periumbilical pain: Secondary | ICD-10-CM | POA: Diagnosis present

## 2018-08-15 LAB — BASIC METABOLIC PANEL
ANION GAP: 7 (ref 5–15)
BUN: 26 mg/dL — AB (ref 8–23)
CALCIUM: 9.7 mg/dL (ref 8.9–10.3)
CO2: 30 mmol/L (ref 22–32)
CREATININE: 1.25 mg/dL — AB (ref 0.61–1.24)
Chloride: 102 mmol/L (ref 98–111)
GFR calc Af Amer: >60 mL/min (ref >60)
GFR calc non Af Amer: 57 mL/min — ABNORMAL LOW (ref >60)
GLUCOSE: 124 mg/dL — AB (ref 70–99)
Potassium: 4.3 mmol/L (ref 3.5–5.1)
Sodium: 139 mmol/L (ref 135–145)

## 2018-08-15 LAB — CBC
HCT: 38.8 % — ABNORMAL LOW (ref 40.0–52.0)
HEMOGLOBIN: 13.5 g/dL (ref 13.0–18.0)
MCH: 30.1 pg (ref 26.0–34.0)
MCHC: 34.8 g/dL (ref 32.0–36.0)
MCV: 86.5 fL (ref 80.0–100.0)
Platelets: 226 10*3/uL (ref 150–440)
RBC: 4.49 MIL/uL (ref 4.40–5.90)
RDW: 13.6 % (ref 11.5–14.5)
WBC: 7.2 10*3/uL (ref 3.8–10.6)

## 2018-08-15 LAB — HEPATIC FUNCTION PANEL
ALT: 25 U/L (ref 0–44)
AST: 27 U/L (ref 15–41)
Albumin: 4.1 g/dL (ref 3.5–5.0)
Alkaline Phosphatase: 70 U/L (ref 38–126)
BILIRUBIN TOTAL: 0.3 mg/dL (ref 0.3–1.2)
Total Protein: 7.6 g/dL (ref 6.5–8.1)

## 2018-08-15 LAB — TROPONIN I

## 2018-08-15 LAB — LIPASE, BLOOD: Lipase: 189 U/L — ABNORMAL HIGH (ref 11–51)

## 2018-08-15 MED ORDER — FAMOTIDINE 20 MG PO TABS
40.0000 mg | ORAL_TABLET | Freq: Once | ORAL | Status: AC
  Administered 2018-08-15: 40 mg via ORAL
  Filled 2018-08-15: qty 2

## 2018-08-15 MED ORDER — FAMOTIDINE 20 MG PO TABS: 20.0000 mg | ORAL_TABLET | Freq: Two times a day (BID) | ORAL | 0 refills | Status: DC

## 2018-08-15 MED ORDER — ALUMINUM-MAGNESIUM-SIMETHICONE 200-200-20 MG/5ML PO SUSP: 30.0000 mL | Freq: Three times a day (TID) | ORAL | 0 refills | Status: DC

## 2018-08-15 MED ORDER — ALUM & MAG HYDROXIDE-SIMETH 200-200-20 MG/5ML PO SUSP
15.0000 mL | Freq: Once | ORAL | Status: AC
  Administered 2018-08-15: 15 mL via ORAL
  Filled 2018-08-15: qty 30

## 2018-08-15 NOTE — ED Triage Notes (Signed)
Pt reports ate some fish last pm and shortly after started with some abdominal pain. Pt denies NVD, states huis stomach just hurts. Denies SOB, weakness or other sx's.

## 2018-08-15 NOTE — ED Notes (Signed)
Registration at bedside.

## 2018-08-15 NOTE — ED Provider Notes (Signed)
[facility] Emergency Department Provider Note  ____________________________________________  Time seen: Approximately 4:27 PM  I have reviewed the triage vital signs and the nursing notes.   HISTORY  Chief Complaint Abdominal Pain    HPI Vincent Bautista is a 72 y.o. male with a history of diabetes hypertension and pancreatitis who complains of periumbilical abdominal pain that started about 2 hours eating after eating fried fish last night.  He reports that he has bad acid reflux and has been told to stay away from fried foods in the past.  Pain is been constant and sharp, improving throughout today.  No aggravating or alleviating factors.  Denies vomiting fevers or chills.  Nonradiating.   Pain is worse lying down at night, better sitting upright.    Past Medical History:  Diagnosis Date  . Bilateral kidney stones   . BPH (benign prostatic hypertrophy)   . Diabetes mellitus, type 2 (HCC)   . Hematuria    gross  . HTN (hypertension)   . Urinary retention      There are no active problems to display for this patient.    Past Surgical History:  Procedure Laterality Date  . none       Prior to Admission medications   Medication Sig Start Date End Date Taking? Authorizing Provider  aluminum-magnesium hydroxide-simethicone (MAALOX) 200-200-20 MG/5ML SUSP Take 30 mLs by mouth 4 (four) times daily -  before meals and at bedtime. 08/15/18   Sharman CheekStafford, Arryanna Holquin, MD  aspirin 81 MG tablet Take 81 mg by mouth daily.    [provider]  famotidine (PEPCID) 20 MG tablet Take 1 tablet (20 mg total) by mouth 2 (two) times daily. 08/15/18   Sharman CheekStafford, Yocelin Vanlue, MD  finasteride (PROSCAR) 5 MG tablet Take 1 tablet (5 mg total) by mouth daily. 07/04/18   Michiel CowboyMcGowan, Shannon A, PA-C  glipiZIDE (GLUCOTROL) 5 MG tablet Take 5 mg by mouth daily.  04/16/16   [provider]  hydrochlorothiazide (HYDRODIURIL) 25 MG tablet Take 25 mg by mouth daily.  04/05/16    [provider]  HYDROcodone-acetaminophen (NORCO/VICODIN) 5-325 MG tablet Take 1 tablet by mouth every 6 (six) hours as needed for moderate pain. 01/25/18   Jene EveryKinner, Robert, MD  ibuprofen (ADVIL,MOTRIN) 600 MG tablet Take 1 tablet (600 mg total) by mouth every 8 (eight) hours as needed. 12/12/16   Governor RooksLord, Rebecca, MD  lisinopril (PRINIVIL,ZESTRIL) 10 MG tablet Take 10 mg by mouth daily.  04/16/16   [provider]  simvastatin (ZOCOR) 20 MG tablet Take 20 mg by mouth daily.  04/05/16   [provider]  tamsulosin (FLOMAX) 0.4 MG CAPS capsule Take 1 capsule (0.4 mg total) by mouth daily. 07/04/18   Michiel CowboyMcGowan, Shannon A, PA-C     Allergies Codeine   Family History  Problem Relation Age of Onset  . Colon cancer Brother   . Cancer Unknown   . Prostate cancer Brother   . Lung cancer Father   . Kidney disease Neg Hx   . Bladder Cancer Neg Hx     Social History Social History   Tobacco Use  . Smoking status: Former Games developermoker  . Smokeless tobacco: Never Used  . Tobacco comment: quit 30 years  Substance Use Topics  . Alcohol use: No    Alcohol/week: 0.0 standard drinks  . Drug use: No    Review of Systems  Constitutional:   No fever or chills.  ENT:   No sore throat. No rhinorrhea. Cardiovascular:  No chest pain or syncope. Respiratory:   No dyspnea or cough. Gastrointestinal:   Positive as above for abdominal pain without vomiting and diarrhea.  Musculoskeletal:   Negative for focal pain or swelling All other systems reviewed and are negative except as documented above in ROS and HPI.  ____________________________________________   PHYSICAL EXAM:  VITAL SIGNS: ED Triage Vitals  Enc Vitals Group     BP 08/15/18 1046 (!) 152/82     Pulse Rate 08/15/18 1046 63     Resp 08/15/18 1046 20     Temp 08/15/18 1046 98.1 F (36.7 C)     Temp Source 08/15/18 1046 Oral     SpO2 08/15/18 1046 100 %     Weight 08/15/18 1047 146 lb (66.2 kg)     Height 08/15/18  1047 5\' 4"  (1.626 m)     Head Circumference --      Peak Flow --      Pain Score 08/15/18 1046 3     Pain Loc --      Pain Edu? --      Excl. in GC? --     Vital signs reviewed, nursing assessments reviewed.   Constitutional:   Alert and oriented. Non-toxic appearance. Eyes:   Conjunctivae are normal. EOMI. PERRL. ENT      Head:   Normocephalic and atraumatic.      Nose:   No congestion/rhinnorhea.       Mouth/Throat:   MMM, no pharyngeal erythema. No peritonsillar mass.       Neck:   No meningismus. Full ROM. Hematological/Lymphatic/Immunilogical:   No cervical lymphadenopathy. Cardiovascular:   RRR. Symmetric bilateral radial and DP pulses.  No murmurs. Cap refill less than 2 seconds. Respiratory:   Normal respiratory effort without tachypnea/retractions. Breath sounds are clear and equal bilaterally. No wheezes/rales/rhonchi. Gastrointestinal:   Soft and nontender. Non distended. There is no CVA tenderness.  No rebound, rigidity, or guarding. Musculoskeletal:   Normal range of motion in all extremities. No joint effusions.  No lower extremity tenderness.  No edema. Neurologic:   Normal speech and language.  Motor grossly intact. No acute focal neurologic deficits are appreciated.  Skin:    Skin is warm, dry and intact. No rash noted.  No petechiae, purpura, or bullae.  ____________________________________________    LABS (pertinent positives/negatives) (all labs ordered are listed, but only abnormal results are displayed) Labs Reviewed  BASIC METABOLIC PANEL - Abnormal; Notable for the following components:      Result Value   Glucose, Bld 124 (*)    BUN 26 (*)    Creatinine, Ser 1.25 (*)    GFR calc non Af Amer 57 (*)    All other components within normal limits  CBC - Abnormal; Notable for the following components:   HCT 38.8 (*)    All other components within normal limits  LIPASE, BLOOD - Abnormal; Notable for the following components:   Lipase 189 (*)    All  other components within normal limits  TROPONIN I  HEPATIC FUNCTION PANEL   ____________________________________________   EKG  Interpreted by me Normal sinus rhythm rate of 60, normal axis and intervals.  Normal QRS ST segments and T waves.  ____________________________________________    RADIOLOGY  No results found.  ____________________________________________   PROCEDURES Procedures  ____________________________________________  DIFFERENTIAL DIAGNOSIS   GERD/gastritis, pancreatitis, choledocholithiasis, intestinal gas pain  CLINICAL IMPRESSION / ASSESSMENT AND PLAN / ED COURSE  Pertinent labs & imaging results that were available  during my care of the patient were reviewed by me and considered in my medical decision making (see chart for details).    Patient presents with periumbilical pain that I suspect is due to GERD and gastritis.  Vital signs are unremarkable and physical exam is benign and reassuring.  Check labs, give antiacids, p.o. trial.  Clinical Course as of Aug 15 1628  Mon Aug 15, 2018  1339 Mild pancreatitis. Abd now benign, pain minimal. Will manage with outpatient follow up to GI, antacids.  Lipase(!): 189 [PS]  1617 Patient eating without recurrent symptoms, suitable for discharge home and outpatient follow-up with gastroenterology clinic.   [PS]    Clinical Course User Index [PS] Sharman Cheek, MD     ____________________________________________   FINAL CLINICAL IMPRESSION(S) / ED DIAGNOSES    Final diagnoses:  Acute gastritis without hemorrhage, unspecified gastritis type  Chronic pancreatitis, unspecified pancreatitis type [facility])     ED Discharge Orders         Ordered    aluminum-magnesium hydroxide-simethicone (MAALOX) 200-200-20 MG/5ML SUSP  3 times daily before meals & bedtime     08/15/18 1623    famotidine (PEPCID) 20 MG tablet  2 times daily     08/15/18 1623          Portions of this note were generated with  dragon dictation software. Dictation errors may occur despite best attempts at proofreading.    Sharman Cheek, MD 08/15/18 1630

## 2018-08-15 NOTE — Discharge Instructions (Signed)
Your blood test today showed mild inflammation of your pancreas, which does not appear to be related to today's symptoms.  Take antacids as prescribed to help with your symptoms.

## 2018-08-15 NOTE — ED Notes (Signed)
Patient given cranberry juice and sandwich tray

## 2018-08-17 ENCOUNTER — Other Ambulatory Visit: Payer: Self-pay | Admitting: Urology

## 2018-09-03 NOTE — Progress Notes (Signed)
Urine cytology showed no evidence of malignant cells. 

## 2018-11-15 ENCOUNTER — Ambulatory Visit (INDEPENDENT_AMBULATORY_CARE_PROVIDER_SITE_OTHER): Payer: Medicare Other | Admitting: Urology

## 2018-11-15 ENCOUNTER — Other Ambulatory Visit: Payer: Self-pay

## 2018-11-15 ENCOUNTER — Encounter: Payer: Self-pay | Admitting: Urology

## 2018-11-15 VITALS — BP 115/68 | HR 83 | Ht 64.0 in | Wt 142.0 lb

## 2018-11-15 DIAGNOSIS — N323 Diverticulum of bladder: Secondary | ICD-10-CM

## 2018-11-15 DIAGNOSIS — R339 Retention of urine, unspecified: Secondary | ICD-10-CM

## 2018-11-15 DIAGNOSIS — N401 Enlarged prostate with lower urinary tract symptoms: Secondary | ICD-10-CM | POA: Diagnosis not present

## 2018-11-15 DIAGNOSIS — Z87448 Personal history of other diseases of urinary system: Secondary | ICD-10-CM

## 2018-11-15 DIAGNOSIS — N138 Other obstructive and reflux uropathy: Secondary | ICD-10-CM

## 2018-11-15 LAB — BLADDER SCAN AMB NON-IMAGING

## 2018-11-15 MED ORDER — TAMSULOSIN HCL 0.4 MG PO CAPS: 0.4000 mg | ORAL_CAPSULE | Freq: Every day | ORAL | 3 refills | Status: DC

## 2018-11-15 MED ORDER — FINASTERIDE 5 MG PO TABS: 5.0000 mg | ORAL_TABLET | Freq: Every day | ORAL | 3 refills | Status: DC

## 2018-11-15 NOTE — Progress Notes (Signed)
8:51 AM   Vincent Bautista [birth year] [id number]  Referring provider: Letta Median, MD [address], [place] [address]  Chief Complaint  Patient presents with  . Benign Prostatic Hypertrophy    HPI: Patient is a 72 year old African American male with BPH with retention, bladder diverticulum and a history of hematuria who presents today for a 3 month follow up.  History of urinary retention Patient was found to be in urinary retention after suffering a TIA in December 2015. Patient was initiated on finasteride and Flomax at that time.  He did undergo cystoscopy in June 2015 and was found to have an enlarged prostate, a bladder diverticulum with hypospadias.  He continues to have large residuals and has failed two voiding trials.  He does not have a good understanding of his situation as he continually asks for fluids pills to help him urinate.  Contrast CT in 01/2018 noted no adrenal lesions are appreciable. Kidneys bilaterally show no evident mass or hydronephrosis on either side.  There is a 1 mm calculus in the lower pole of the left kidney. No other intrarenal calculi are evident. There is no ureteral calculus on either side. Urinary bladder wall is not appreciably thickened.  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  CTU in 06/2018 revealed normal appearance of the adrenal glands. Two tiny stones are identified within the inferior pole collecting system of the left kidney measuring up to 2-3 mm, image 33/2 and image 32/2. No right renal calculi identified. No kidney mass or hydronephrosis identified bilaterally. Large volume urinary bladder is identified. The bladder measures approximately 15.2 x 8.9 by 10.7 cm (volume = 760 cm^3). Posterior bladder wall diverticulum is again noted measuring 3.9 cm, similar to previous exam. No suspicious filling defects identified within the bladder.  Prostate is  unremarkable.  Most recent serum creatinine was 1.25 in 07/2018 with > 60 eGFR.    BPH WITH LUTS Today's I PSS score is 12/2.   His PVR today is 596 mL.  His previous IPSS was 13/2.  His previous PVR score was 514 mL.  He denies any dysuria, hematuria or suprapubic pain.  He is currently taking tamsulosin 0.4 mg daily and finasteride 5 mg daily.  He also denies any recent fevers, chills, nausea or vomiting.  He has a family history of prostate cancer in his brother.  IPSS    Row Name 11/15/18 0800         International Prostate Symptom Score   How often have you had the sensation of not emptying your bladder?  About half the time     How often have you had to urinate less than every two hours?  About half the time     How often have you found you stopped and started again several times when you urinated?  Less than half the time     How often have you found it difficult to postpone urination?  Not at All     How often have you had a weak urinary stream?  About half the time     How often have you had to strain to start urination?  Less than 1 in 5 times     How many times did you typically get up at night to urinate?  None     Total IPSS Score  12       Quality of Life due  to urinary symptoms   If you were to spend the rest of your life with your urinary condition just the way it is now how would you feel about that?  Mostly Satisfied        Score:  1-7 Mild 8-19 Moderate 20-35 Severe  History of  hematuria Former smoker.  Prior hematuria work up in 2015 with CTU and cystoscopy noted bilateral punctate stones, enlarged prostate, a bladder diverticulum with hypospadias.   CTU in 06/2018 found no malignancies.  Cysto in 07/2018 revealed moderate trabeculation; large posterior wall bladder diverticulum.  Urine cytology was negative.  He has no reports of gross hematuria.    PMH: Past Medical History:  Diagnosis Date  . Bilateral kidney stones   . BPH (benign prostatic hypertrophy)     . Diabetes mellitus, type 2 ([place])   . Hematuria    gross  . HTN (hypertension)   . Urinary retention     Surgical History: Past Surgical History:  Procedure Laterality Date  . none      Home Medications:  Allergies as of 11/15/2018      Reactions   Codeine Nausea And Vomiting      Medication List        Accurate as of 11/15/18  8:51 AM. Always use your most recent med list.          aluminum-magnesium hydroxide-simethicone 096-283-66 MG/5ML Susp Commonly known as:  MAALOX Take 30 mLs by mouth 4 (four) times daily -  before meals and at bedtime.   aspirin 81 MG tablet Take 81 mg by mouth daily.   famotidine 20 MG tablet Commonly known as:  PEPCID Take 1 tablet (20 mg total) by mouth 2 (two) times daily.   finasteride 5 MG tablet Commonly known as:  PROSCAR Take 1 tablet (5 mg total) by mouth daily.   glipiZIDE 5 MG tablet Commonly known as:  GLUCOTROL Take 5 mg by mouth daily.   hydrochlorothiazide 25 MG tablet Commonly known as:  HYDRODIURIL Take 25 mg by mouth daily.   HYDROcodone-acetaminophen 5-325 MG tablet Commonly known as:  NORCO/VICODIN Take 1 tablet by mouth every 6 (six) hours as needed for moderate pain.   ibuprofen 600 MG tablet Commonly known as:  ADVIL,MOTRIN Take 1 tablet (600 mg total) by mouth every 8 (eight) hours as needed.   lisinopril 10 MG tablet Commonly known as:  PRINIVIL,ZESTRIL Take 10 mg by mouth daily.   simvastatin 20 MG tablet Commonly known as:  ZOCOR Take 20 mg by mouth daily.   tamsulosin 0.4 MG Caps capsule Commonly known as:  FLOMAX Take 1 capsule (0.4 mg total) by mouth daily.       Allergies:  Allergies  Allergen Reactions  . Codeine Nausea And Vomiting    Family History: Family History  Problem Relation Age of Onset  . Colon cancer Brother   . Cancer Unknown   . Prostate cancer Brother   . Lung cancer Father   . Kidney disease Neg Hx   . Bladder Cancer Neg Hx     Social History:  reports  that he has quit smoking. He has never used smokeless tobacco. He reports that he does not drink alcohol or use drugs.  ROS: UROLOGY Frequent Urination?: No Hard to postpone urination?: No Burning/pain with urination?: No Get up at night to urinate?: No Leakage of urine?: No Urine stream starts and stops?: No Trouble starting stream?: No Do you have to strain to urinate?: No Blood  in urine?: No Urinary tract infection?: No Sexually transmitted disease?: No Injury to kidneys or bladder?: No Painful intercourse?: No Weak stream?: No Erection problems?: No Penile pain?: No  Gastrointestinal Nausea?: No Vomiting?: No Indigestion/heartburn?: No Diarrhea?: No Constipation?: No  Constitutional Fever: No Night sweats?: No Weight loss?: No Fatigue?: No  Skin Skin rash/lesions?: No Itching?: No  Eyes Blurred vision?: No Double vision?: No  Ears/Nose/Throat Sore throat?: No Sinus problems?: No  Hematologic/Lymphatic Swollen glands?: No Easy bruising?: No  Cardiovascular Leg swelling?: No Chest pain?: No  Respiratory Cough?: No Shortness of breath?: No  Endocrine Excessive thirst?: No  Musculoskeletal Back pain?: No Joint pain?: No  Neurological Headaches?: No Dizziness?: No  Psychologic Depression?: No Anxiety?: No  Physical Exam: BP 115/68   Pulse 83   Ht _0  (1.626 m)   Wt 142 lb (64.4 kg)   BMI 24.37 kg/m   Constitutional: Well nourished. Alert and oriented, No acute distress. HEENT: [place] AT, moist mucus membranes. Trachea midline, no masses. Cardiovascular: No clubbing, cyanosis, or edema. Respiratory: Normal respiratory effort, no increased work of breathing. GI: Abdomen is soft, non tender, non distended, no abdominal masses. Liver and spleen not palpable.  No hernias appreciated.  Stool sample for occult testing is not indicated.   GU: No CVA tenderness.  No bladder fullness or masses.  Patient with uncircumcised phallus.  Foreskin  easily retracted.  Urethral meatus is patent.  Coronal hypospadias.  No penile discharge. No penile lesions or rashes. Scrotum without lesions, cysts, rashes and/or edema.  Testicles are located scrotally bilaterally. No masses are appreciated in the testicles. Left and right epididymis are normal. Rectal: Patient with  normal sphincter tone. Anus and perineum without scarring or rashes. No rectal masses are appreciated. Prostate is approximately 50 grams, could only palpate the apex and the midportion of the glands, no nodules are appreciated.  Skin: No rashes, bruises or suspicious lesions. Lymph: No cervical or inguinal adenopathy. Neurologic: Grossly intact, no focal deficits, moving all 4 extremities. Psychiatric: Normal mood and affect.    Laboratory Data: PSA Trend  3.1 in 11/2014 - started on finasteride  0.2 (0.4) in 05/2016  0.1 (0.2) in 12/2016  0.2 (0.4) in 06/2017  0.1 (0.2) in 12/2017  0.1 (0.2) in 06/2018 Lab Results  Component Value Date   CREATININE 1.25 (H) 08/15/2018   I have reviewed the labs.    Pertinent imaging Results for STANLEY, LYNESS (MRN [id number]) as of 11/15/2018 08:45  Ref. Range 11/15/2018 08:29  Scan Result Unknown 527m    Assessment & Plan:    1. History of urinary retention Recent serum creatinine 1.25 (stable) - rechecked today No hydro on CTU in 06/2018 Continue tamsulosin 0.4 mg and finasteride 5 mg daily Advised to contact the office or seek treatment in the emergency department if he is unable to void  2. BPH (benign prostatic hyperplasia) with urinary retention:   IPSS score is 13/2, it is stable Continue conservative management, avoiding bladder irritants and timed voiding's Continue tamsulosin 0.4 mg daily and finasteride 5 mg daily; refills given RTC in 6 months for I PSS, PSA, PVR, creatinine and exam - if PSA is stable and creatinine stable   3. Bladder diverticulum May be contributing the large residuals  4. History of  hematuria Hematuria work up completed in 07/2018 - findings positive for large bladder diverticulum and two punctate stones in left kidney No report of gross hematuria  RTC in one year for UA (07/2019) -  patient to report any gross hematuria in the interim     Return in about 6 months (around 05/16/2019) for IPSS, PSA, PVR, UA and exam.  These notes generated with voice recognition software. I apologize for typographical errors.  Zara Council, PA-C  [facility] [address] [place], [place] [address] [phone]

## 2018-11-16 ENCOUNTER — Telehealth: Payer: Self-pay

## 2018-11-16 DIAGNOSIS — R7989 Other specified abnormal findings of blood chemistry: Secondary | ICD-10-CM

## 2018-11-16 DIAGNOSIS — Z87898 Personal history of other specified conditions: Secondary | ICD-10-CM

## 2018-11-16 LAB — PSA: Prostate Specific Ag, Serum: 0.4 ng/mL (ref 0.0–4.0)

## 2018-11-16 LAB — BUN+CREAT
BUN/Creatinine Ratio: 17 (ref 10–24)
BUN: 24 mg/dL (ref 8–27)
Creatinine, Ser: 1.4 mg/dL — ABNORMAL HIGH (ref 0.76–1.27)
GFR calc Af Amer: 58 mL/min/{1.73_m2} — ABNORMAL LOW (ref >59)
GFR calc non Af Amer: 51 mL/min/{1.73_m2} — ABNORMAL LOW (ref >59)

## 2018-11-16 NOTE — Telephone Encounter (Signed)
Called pt informed him of the information below. Pt gave verbal understanding. Lab appt scheduled, orders placed.

## 2018-11-16 NOTE — Telephone Encounter (Signed)
-----   Message from Harle BattiestShannon A McGowan, PA-C sent at 11/16/2018  7:41 AM EST ----- Please let [name] know that his PSA is normal, but his kidney function is a little worse.  I would like him to have a repeat BUN + creatinine in three months.

## 2019-02-16 ENCOUNTER — Other Ambulatory Visit: Payer: Medicare Other

## 2019-02-16 DIAGNOSIS — Z87898 Personal history of other specified conditions: Secondary | ICD-10-CM

## 2019-02-16 DIAGNOSIS — R7989 Other specified abnormal findings of blood chemistry: Secondary | ICD-10-CM

## 2019-02-17 ENCOUNTER — Telehealth: Payer: Self-pay

## 2019-02-17 LAB — BUN+CREAT
BUN/Creatinine Ratio: 23 (ref 10–24)
BUN: 25 mg/dL (ref 8–27)
Creatinine, Ser: 1.09 mg/dL (ref 0.76–1.27)
GFR calc Af Amer: 79 mL/min/{1.73_m2} (ref >59)
GFR calc non Af Amer: 68 mL/min/{1.73_m2} (ref >59)

## 2019-02-17 NOTE — Telephone Encounter (Signed)
Left message for patient to return call.

## 2019-02-17 NOTE — Telephone Encounter (Signed)
Pt called office and I read message from Shannon °

## 2019-02-17 NOTE — Telephone Encounter (Signed)
-----   Message from Harle Battiest, PA-C sent at 02/17/2019  7:52 AM EST ----- Please let Vincent Bautista know that his renal function has improved and is now within normal parameters.

## 2019-05-11 ENCOUNTER — Other Ambulatory Visit: Payer: Medicare Other

## 2019-05-16 ENCOUNTER — Ambulatory Visit: Payer: Medicare Other | Admitting: Urology

## 2019-05-18 IMAGING — CT CT ABD-PEL WO/W CM
2 of 3 series · 16 of 41 positions shown, 18 images · IV contrast (iopamidol)
Comparison: 01/25/2018

CLINICAL DATA: Microscopic hematuria

EXAM:
CT ABDOMEN AND PELVIS WITHOUT AND WITH CONTRAST
TECHNIQUE: Multidetector CT imaging of the abdomen and pelvis was performed
following the standard protocol before and following the bolus
administration of intravenous contrast.
CONTRAST:  100mL ZVR1FD-0XX IOPAMIDOL (ZVR1FD-0XX) INJECTION 61%

[Series 19: lungs delay delay prone · axial · delayed · 0.61mm/px · z∈[-1253,-1133]mm · 13 of 27 slices shown, 15 images]
[im 2/27  soft-tissue]
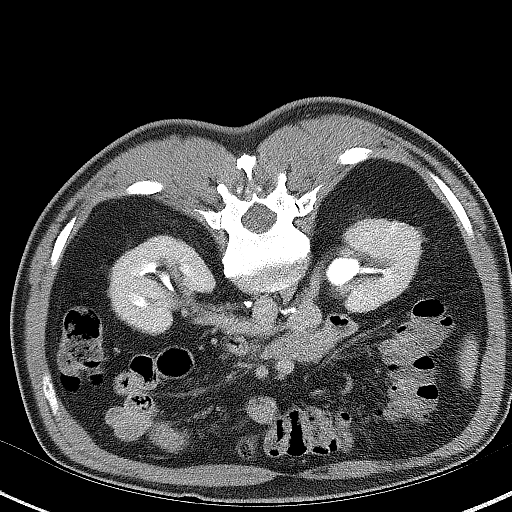
[im 2/27  bone]
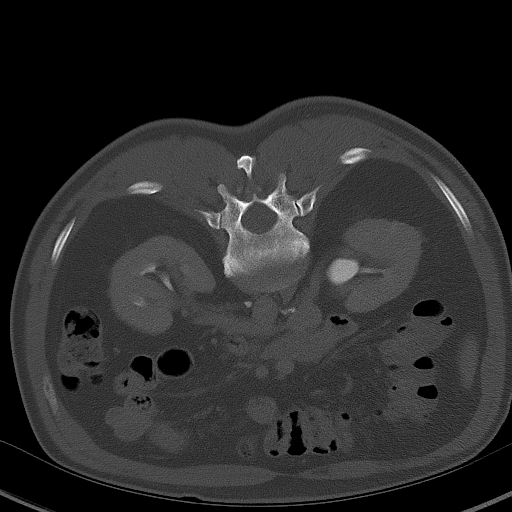
[im 4/27  soft-tissue]
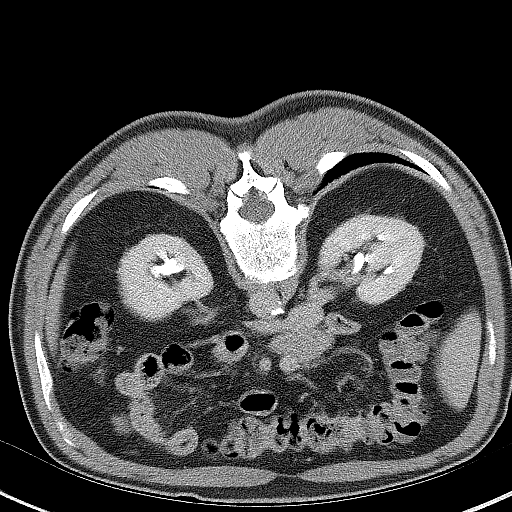
[im 6/27  soft-tissue]
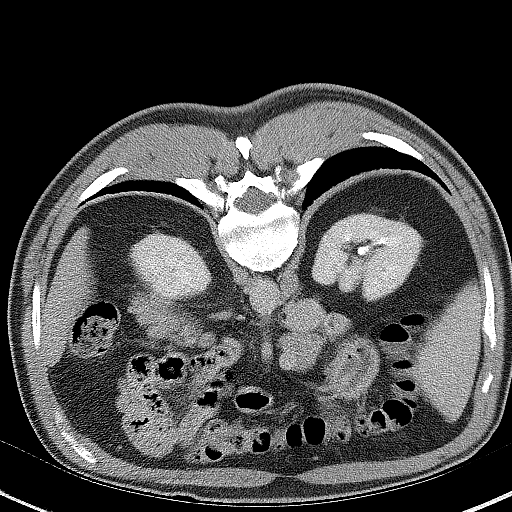
[im 8/27  soft-tissue]
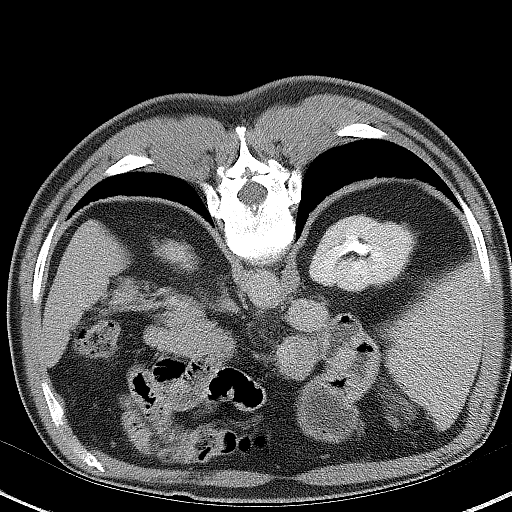
[im 10/27  soft-tissue]
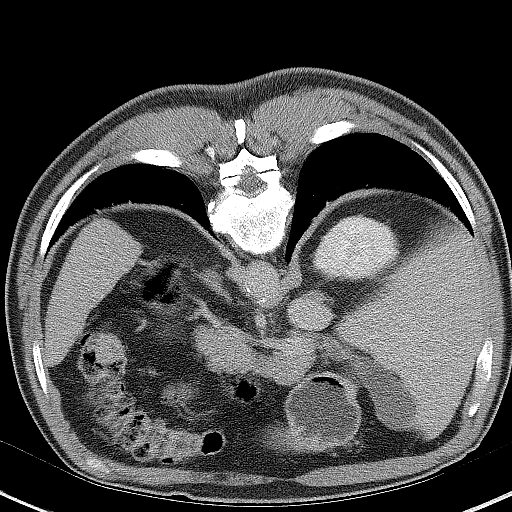
[im 12/27  soft-tissue]
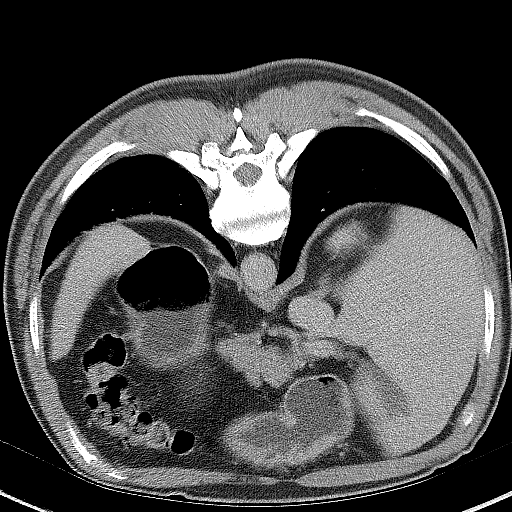
[im 14/27  soft-tissue]
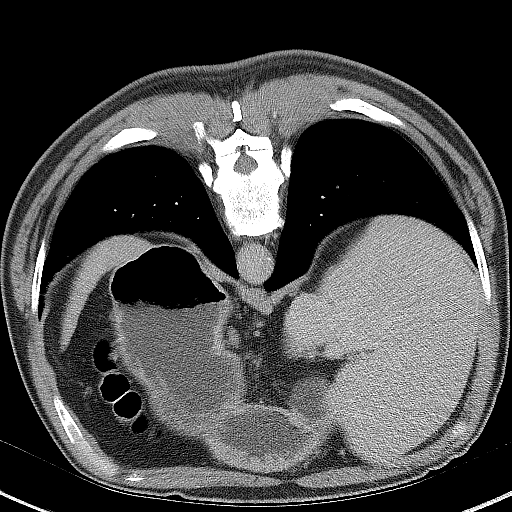
[im 16/27  soft-tissue]
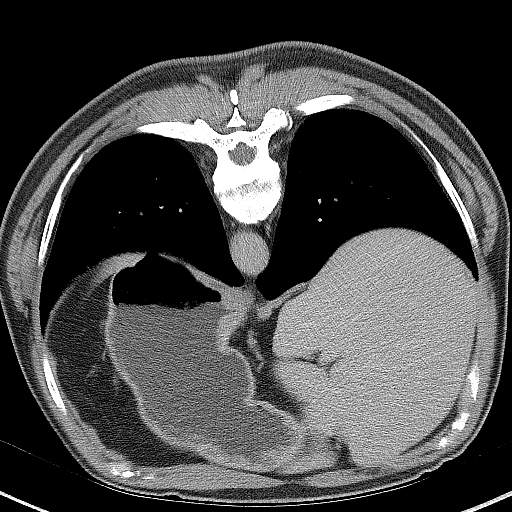
[im 18/27  soft-tissue]
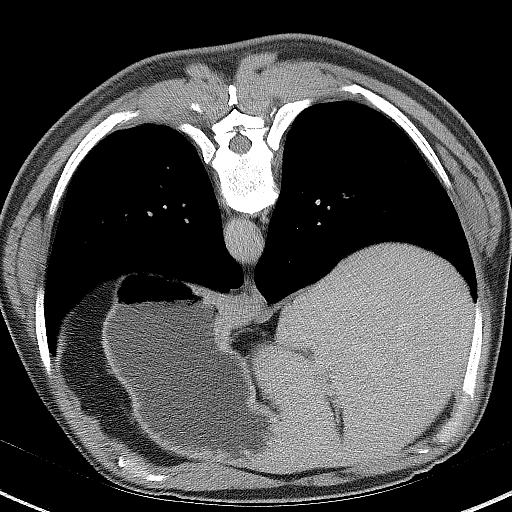
[im 18/27  bone]
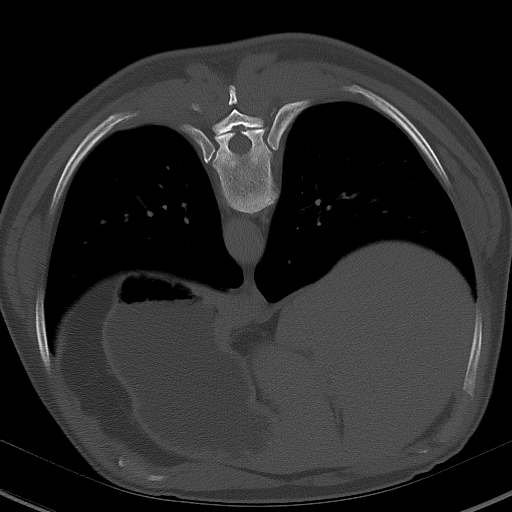
[im 20/27  soft-tissue]
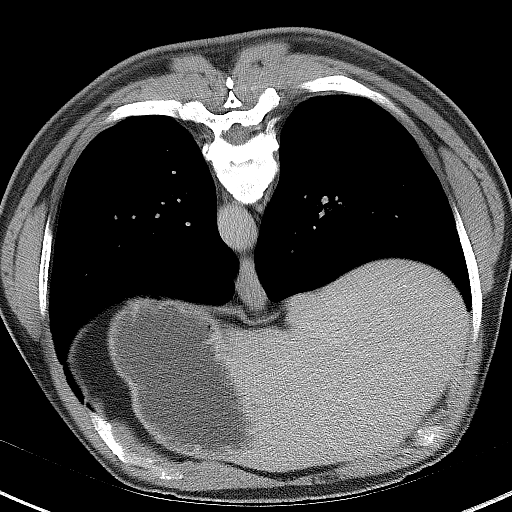
[im 22/27  soft-tissue]
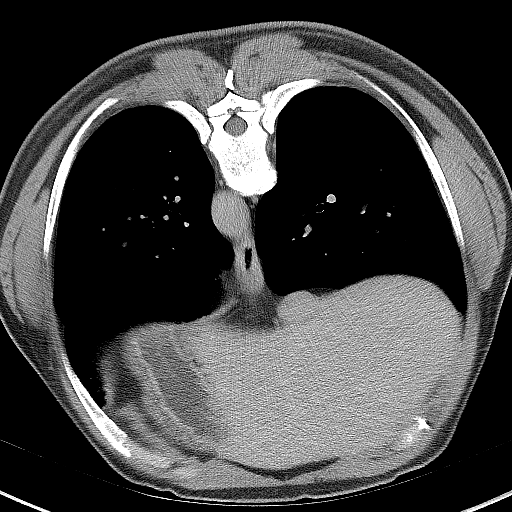
[im 24/27  soft-tissue]
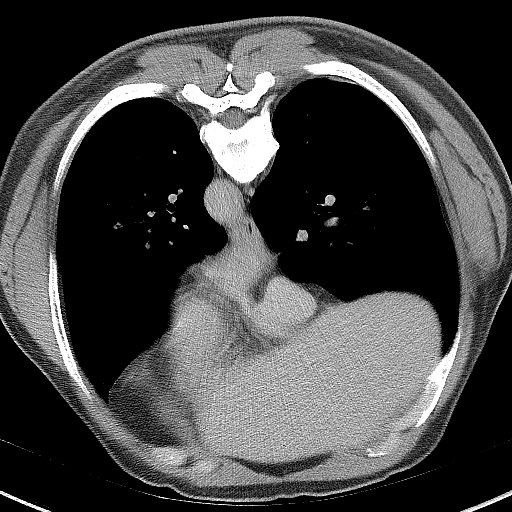
[im 26/27  soft-tissue]
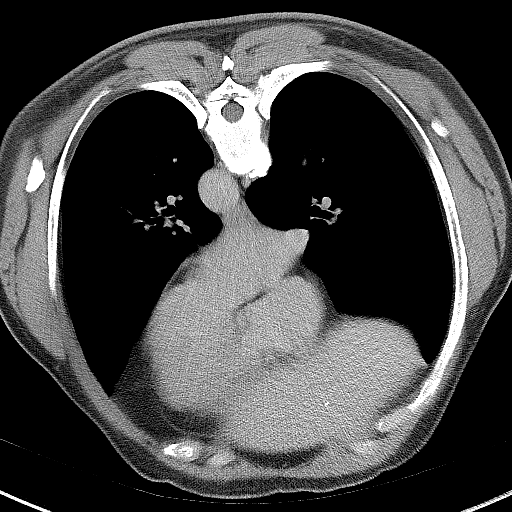

[Series 20: cor delay delay prone · coronal · delayed · 0.63mm/px · 3 of 161 slices shown]
[im 54/161  soft-tissue]
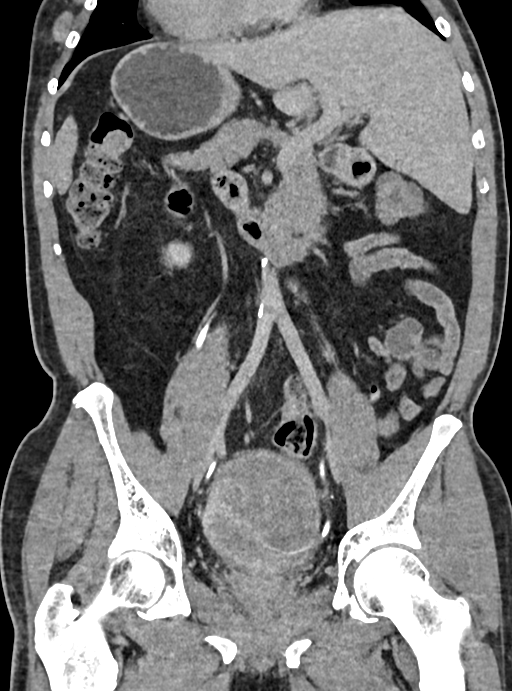
[im 72/161  soft-tissue]
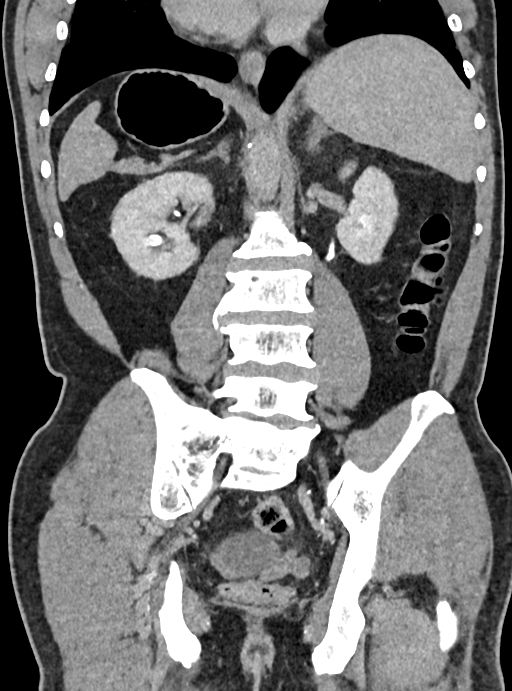
[im 89/161  soft-tissue]
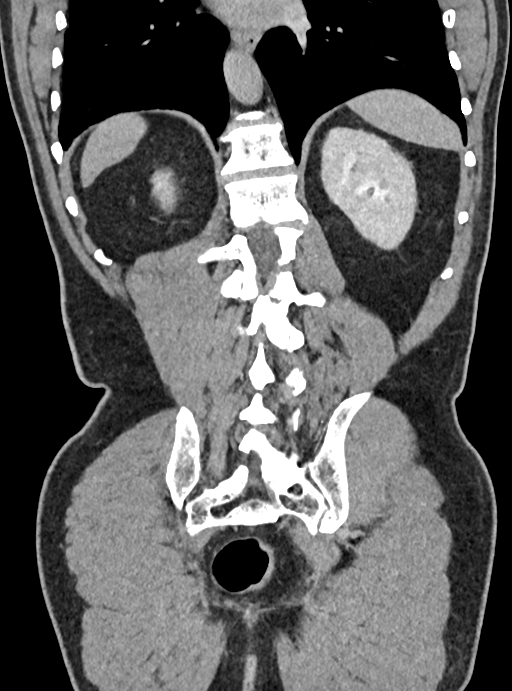

[16 of 41 positions shown; findings below may reference images not displayed]

FINDINGS: Lower chest: No acute abnormality.

Hepatobiliary: No focal liver abnormality is seen. No gallstones,
gallbladder wall thickening, or biliary dilatation.

Pancreas: Unremarkable. No pancreatic ductal dilatation or
surrounding inflammatory changes.

Spleen: Normal in size without focal abnormality.

Adrenals/Urinary Tract: Normal appearance of the adrenal glands. Two
tiny stones are identified within the inferior pole collecting
system of the left kidney measuring up to 2-3 mm, image 33/2 and
image 32/2. No right renal calculi identified. No kidney mass or
hydronephrosis identified bilaterally. Large volume urinary bladder
is identified. The bladder measures approximately 15.2 x 8.9 by
cm (volume = 760 cm^3). Posterior bladder wall diverticulum is
again noted measuring 3.9 cm, similar to previous exam. No
suspicious filling defects identified within the bladder.

Stomach/Bowel: Stomach is within normal limits. Appendix appears
normal. No evidence of bowel wall thickening, distention, or
inflammatory changes. Scattered distal colonic diverticula noted
without acute inflammation.

Vascular/Lymphatic: Aortic atherosclerosis. No aneurysm. No
abdominal or pelvic adenopathy. No inguinal adenopathy.

Reproductive: Prostate is unremarkable.

Other: No abdominal wall hernia or abnormality. No abdominopelvic
ascites.

Musculoskeletal: Lumbar scoliosis and degenerative disc disease
noted.
IMPRESSION: 1. No acute findings identified.
2. Small left lower pole renal calculi.
3. Large volume urinary bladder with posterior wall diverticulum.
4.  Aortic Atherosclerosis ([id number]).

## 2019-06-15 NOTE — Progress Notes (Signed)
4:21 PM   Vincent Bautista [facility] [birth year] [id number]  Referring provider: Letta Median, MD [place],  [place] [address]  Chief Complaint  Patient presents with  . Bladder Prolapse    HPI: Patient is a 73 year old African American male with BPH with retention, bladder diverticulum and a history of hematuria who presents today for follow up.  History of urinary retention Patient was found to be in urinary retention after suffering a TIA in December 2015. Patient was initiated on finasteride and Flomax at that time.  He did undergo cystoscopy in June 2015 and was found to have an enlarged prostate, a bladder diverticulum with hypospadias.  He continues to have large residuals and has failed two voiding trials.  He does not have a good understanding of his situation as he continually asks for fluids pills to help him urinate.  Contrast CT in 01/2018 noted no adrenal lesions are appreciable. Kidneys bilaterally show no evident mass or hydronephrosis on either side.  There is a 1 mm calculus in the lower pole of the left kidney. No other intrarenal calculi are evident. There is no ureteral calculus on either side. Urinary bladder wall is not appreciably thickened.  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  CTU in 06/2018 revealed normal appearance of the adrenal glands. Two tiny stones are identified within the inferior pole collecting system of the left kidney measuring up to 2-3 mm, image 33/2 and image 32/2. No right renal calculi identified. No kidney mass or hydronephrosis identified bilaterally. Large volume urinary bladder is identified. The bladder measures approximately 15.2 x 8.9 by 10.7 cm (volume = 760 cm^3). Posterior bladder wall diverticulum is again noted measuring 3.9 cm, similar to previous exam. No suspicious filling defects identified within the bladder.  Prostate is unremarkable.  Most recent  serum creatinine was 1.09 in 01/2019 with 70 eGFR.    BPH WITH LUTS Today's I PSS score is 10/1.   His PVR today is 890 mL.  His previous IPSS was 12/2.  His previous PVR score was 596 mL.  He denies any dysuria, hematuria or suprapubic pain.  He is currently taking tamsulosin 0.4 mg daily and finasteride 5 mg daily.  He also denies any recent fevers, chills, nausea or vomiting.  He has a family history of prostate cancer in his brother.  IPSS    Row Name 06/16/19 1000         International Prostate Symptom Score   How often have you had the sensation of not emptying your bladder?  Less than half the time     How often have you had to urinate less than every two hours?  Less than half the time     How often have you found you stopped and started again several times when you urinated?  Less than half the time     How often have you found it difficult to postpone urination?  Less than half the time     How often have you had to strain to start urination?  Less than 1 in 5 times     How many times did you typically get up at night to urinate?  1 Time     Total IPSS Score  10       Quality of Life due to urinary symptoms   If you were to spend the rest of your life with your  urinary condition just the way it is now how would you feel about that?  Pleased        Score:  1-7 Mild 8-19 Moderate 20-35 Severe  History of  hematuria Former smoker.  Prior hematuria work up in 2015 with CTU and cystoscopy noted bilateral punctate stones, enlarged prostate, a bladder diverticulum with hypospadias.   CTU in 06/2018 found no malignancies.  Cysto in 07/2018 revealed moderate trabeculation; large posterior wall bladder diverticulum.  Urine cytology was negative.  He has no reports of gross hematuria.  His UA today is positive for 3-10 RBC's.    PMH: Past Medical History:  Diagnosis Date  . Bilateral kidney stones   . BPH (benign prostatic hypertrophy)   . Diabetes mellitus, type 2 ([place])   .  Hematuria    gross  . HTN (hypertension)   . Urinary retention     Surgical History: Past Surgical History:  Procedure Laterality Date  . none      Home Medications:  Allergies as of 06/16/2019      Reactions   Codeine Nausea And Vomiting      Medication List       Accurate as of June 16, 2019 11:59 PM. If you have any questions, ask your nurse or doctor.        aluminum-magnesium hydroxide-simethicone 951-884-16 MG/5ML Susp Commonly known as: MAALOX Take 30 mLs by mouth 4 (four) times daily -  before meals and at bedtime.   aspirin 81 MG tablet Take 81 mg by mouth daily.   famotidine 20 MG tablet Commonly known as: PEPCID Take 1 tablet (20 mg total) by mouth 2 (two) times daily.   finasteride 5 MG tablet Commonly known as: PROSCAR Take 1 tablet (5 mg total) by mouth daily.   glipiZIDE 5 MG tablet Commonly known as: GLUCOTROL Take 5 mg by mouth daily.   hydrochlorothiazide 25 MG tablet Commonly known as: HYDRODIURIL Take 25 mg by mouth daily.   HYDROcodone-acetaminophen 5-325 MG tablet Commonly known as: NORCO/VICODIN Take 1 tablet by mouth every 6 (six) hours as needed for moderate pain.   ibuprofen 600 MG tablet Commonly known as: ADVIL Take 1 tablet (600 mg total) by mouth every 8 (eight) hours as needed.   lisinopril 10 MG tablet Commonly known as: ZESTRIL Take 10 mg by mouth daily.   simvastatin 20 MG tablet Commonly known as: ZOCOR Take 20 mg by mouth daily.   tamsulosin 0.4 MG Caps capsule Commonly known as: FLOMAX Take 1 capsule (0.4 mg total) by mouth daily.       Allergies:  Allergies  Allergen Reactions  . Codeine Nausea And Vomiting    Family History: Family History  Problem Relation Age of Onset  . Colon cancer Brother   . Cancer Unknown   . Prostate cancer Brother   . Lung cancer Father   . Kidney disease Neg Hx   . Bladder Cancer Neg Hx     Social History:  reports that he has quit smoking. He has never used  smokeless tobacco. He reports that he does not drink alcohol or use drugs.  ROS: UROLOGY Frequent Urination?: No Hard to postpone urination?: No Burning/pain with urination?: No Get up at night to urinate?: No Leakage of urine?: No Urine stream starts and stops?: No Trouble starting stream?: No Do you have to strain to urinate?: No Blood in urine?: No Urinary tract infection?: No Sexually transmitted disease?: No Injury to kidneys or bladder?: No Painful intercourse?:  No Weak stream?: Yes Erection problems?: No Penile pain?: No  Gastrointestinal Nausea?: No Vomiting?: No Indigestion/heartburn?: No Diarrhea?: No Constipation?: No  Constitutional Fever: No Night sweats?: No Weight loss?: No Fatigue?: No  Skin Skin rash/lesions?: No Itching?: No  Eyes Blurred vision?: No Double vision?: No  Ears/Nose/Throat Sore throat?: No Sinus problems?: No  Hematologic/Lymphatic Swollen glands?: No Easy bruising?: No  Cardiovascular Leg swelling?: No Chest pain?: No  Respiratory Cough?: No Shortness of breath?: No  Endocrine Excessive thirst?: No  Musculoskeletal Back pain?: No Joint pain?: No  Neurological Headaches?: No Dizziness?: No  Psychologic Depression?: No Anxiety?: No  Physical Exam: BP 129/77 (BP Location: Left Arm, Patient Position: Sitting)   Pulse 82   Ht '5\' 4"'$  (1.626 m)   Wt 145 lb (65.8 kg)   BMI 24.89 kg/m   Constitutional:  Well nourished. Alert and oriented, No acute distress. HEENT: [place] AT, moist mucus membranes.  Trachea midline, no masses. Cardiovascular: No clubbing, cyanosis, or edema. Respiratory: Normal respiratory effort, no increased work of breathing. GI: Abdomen is soft, non tender, non distended, no abdominal masses. Liver and spleen not palpable.  No hernias appreciated.  Stool sample for occult testing is not indicated.   GU: No CVA tenderness.  Palpable bladder.  Patient with uncircumcised phallus.  Foreskin easily  retracted. Urethral meatus is patent.  No penile discharge. No penile lesions or rashes. Scrotum without lesions, cysts, rashes and/or edema.  Testicles are located scrotally bilaterally. No masses are appreciated in the testicles. Left and right epididymis are normal. Rectal: Patient with  normal sphincter tone. Anus and perineum without scarring or rashes. No rectal masses are appreciated. Prostate is approximately 50 grams, could only palpate the apex and the midportion of the gland, no nodules are appreciated.  Skin: No rashes, bruises or suspicious lesions. Lymph: No inguinal adenopathy. Neurologic: Grossly intact, no focal deficits, moving all 4 extremities. Psychiatric: Normal mood and affect.  Laboratory Data: PSA Trend  3.1 in 11/2014 - started on finasteride  0.2 (0.4) in 05/2016  0.1 (0.2) in 12/2016  0.2 (0.4) in 06/2017  0.1 (0.2) in 12/2017  0.1 (0.2) in 06/2018             0.4 (0.8) in 10/2018 Lab Results  Component Value Date   CREATININE 1.09 02/16/2019   I have reviewed the labs.    Pertinent imaging Results for TYRRELL, STEPHENS (MRN [id number]) as of 06/17/2019 16:15  Ref. Range 06/16/2019 10:37  Scan Result Unknown 851m    Continuous Intermittent Catheterization  Due to retention patient is present today for a teaching of self I & O Catheterization. Patient was given detailed verbal and printed instructions of self catheterization. Patient was cleaned and prepped in a sterile fashion.  With instruction and assistance patient inserted a 14 F Speedi Cath and urine return was noted 1000 ml, urine was yellow in color. Patient tolerated well, no complications were noted Patient was given a sample bag with supplies to take home.  Instructions were given for patient to cath up to two times times daily.  An order was placed with [facility] for catheters to be sent to the patient's home. Patient is to follow up in one week.   Performed by: SZara Council PA-C and SThomas Hoff PA  Assessment & Plan:    1. History of urinary retention Recent serum creatinine 1.09 No hydro on CTU in 06/2018 Continue tamsulosin 0.4 mg and finasteride 5 mg daily Patient caths two times  daily, indefinitely due to urinary retention.   He will return in one week for recheck  Advised to contact the office or seek treatment in the emergency department if he is unable to void  2. BPH (benign prostatic hyperplasia) with urinary retention:   IPSS score is 10/1, it is improving  Continue conservative management, avoiding bladder irritants and timed voiding's Continue tamsulosin 0.4 mg daily and finasteride 5 mg daily; refills given  RTC in 6 months for I PSS, PSA, PVR, creatinine and exam - if PSA is stable   3. Bladder diverticulum No intervention warranted   4. History of hematuria Hematuria work up completed in 07/2018 - findings positive for large bladder diverticulum and two punctate stones in left kidney UA with 3-10 RBC's - recent work up negative for malignancies - hematuria likely due to urinary retention No report of gross hematuria  RTC in one year for UA (07/2019) - patient to report any gross hematuria in the interim     Return in about 1 week (around 06/23/2019) for follow up .  These notes generated with voice recognition software. I apologize for typographical errors.  Zara Council, PA-C  [facility] [address] [place], [place] [address] [phone]

## 2019-06-16 ENCOUNTER — Other Ambulatory Visit: Payer: Self-pay

## 2019-06-16 ENCOUNTER — Encounter: Payer: Self-pay | Admitting: Urology

## 2019-06-16 ENCOUNTER — Ambulatory Visit (INDEPENDENT_AMBULATORY_CARE_PROVIDER_SITE_OTHER): Payer: Medicare Other | Admitting: Urology

## 2019-06-16 VITALS — BP 129/77 | HR 82 | Ht 64.0 in | Wt 145.0 lb

## 2019-06-16 DIAGNOSIS — N138 Other obstructive and reflux uropathy: Secondary | ICD-10-CM

## 2019-06-16 DIAGNOSIS — N323 Diverticulum of bladder: Secondary | ICD-10-CM | POA: Diagnosis not present

## 2019-06-16 DIAGNOSIS — N401 Enlarged prostate with lower urinary tract symptoms: Secondary | ICD-10-CM

## 2019-06-16 DIAGNOSIS — Z87898 Personal history of other specified conditions: Secondary | ICD-10-CM

## 2019-06-16 DIAGNOSIS — Z87448 Personal history of other diseases of urinary system: Secondary | ICD-10-CM

## 2019-06-16 LAB — MICROSCOPIC EXAMINATION: WBC, UA: >30 /hpf — AB (ref 0–5)

## 2019-06-16 LAB — URINALYSIS, COMPLETE
Bilirubin, UA: NEGATIVE
Glucose, UA: NEGATIVE
Ketones, UA: NEGATIVE
Nitrite, UA: NEGATIVE
Protein,UA: NEGATIVE
Specific Gravity, UA: 1.02 (ref 1.005–1.030)
Urobilinogen, Ur: 0.2 mg/dL (ref 0.2–1.0)
pH, UA: 6 (ref 5.0–7.5)

## 2019-06-16 LAB — BLADDER SCAN AMB NON-IMAGING

## 2019-06-17 LAB — PSA: Prostate Specific Ag, Serum: 0.3 ng/mL (ref 0.0–4.0)

## 2019-06-19 ENCOUNTER — Telehealth: Payer: Self-pay

## 2019-06-19 NOTE — Telephone Encounter (Signed)
-----   Message from Nori Riis, PA-C sent at 06/17/2019  4:23 PM EDT ----- Please let Vincent Bautista know that his PSA is normal at 0.3.

## 2019-06-19 NOTE — Telephone Encounter (Signed)
Called and advised patient of PSA results. Patient verbalized understanding and confirmed appt on 06/27/19.

## 2019-06-21 ENCOUNTER — Telehealth: Payer: Self-pay | Admitting: Urology

## 2019-06-21 NOTE — Telephone Encounter (Signed)
Pt left message asking to have someone call him about his catheters, says the place he was getting them from will not send him anymore. He would like to talk to someone soon. Please advise. Thanks.

## 2019-06-22 NOTE — Telephone Encounter (Signed)
Called pt he states that [facility] needs more information from [place] in order to fill catheters. Will call.

## 2019-06-26 NOTE — Progress Notes (Signed)
1:20 PM   Zaiah Eckerson [facility] [birth year] [id number]  Referring provider: Letta Median, MD [place],  [place] [id number]  No chief complaint on file.   HPI: Patient is a 73 year old male with BPH with retention, bladder diverticulum and a history of hematuria who presents for follow up after instruction in CIC.    History of urinary retention Patient was found to be in urinary retention after suffering a TIA in December 2015. Patient was initiated on finasteride and Flomax at that time.  He did undergo cystoscopy in June 2015 and was found to have an enlarged prostate, a bladder diverticulum with hypospadias.  He continues to have large residuals and has failed two voiding trials.  He does not have a good understanding of his situation as he continually asks for fluids pills to help him urinate.  Contrast CT in 01/2018 noted no adrenal lesions are appreciable. Kidneys bilaterally show no evident mass or hydronephrosis on either side.  There is a 1 mm calculus in the lower pole of the left kidney. No other intrarenal calculi are evident. There is no ureteral calculus on either side. Urinary bladder wall is not appreciably thickened.  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  CTU in 06/2018 revealed normal appearance of the adrenal glands. Two tiny stones are identified within the inferior pole collecting system of the left kidney measuring up to 2-3 mm, image 33/2 and image 32/2. No right renal calculi identified. No kidney mass or hydronephrosis identified bilaterally. Large volume urinary bladder is identified. The bladder measures approximately 15.2 x 8.9 by 10.7 cm (volume = 760 cm^3). Posterior bladder wall diverticulum is again noted measuring 3.9 cm, similar to previous exam. No suspicious filling defects identified within the bladder.  Prostate is unremarkable.  Most recent serum creatinine was 1.09 in  01/2019 with 70 eGFR.   He was instructed in CIC at his last visit one week ago.    He has been cathing once daily and receiving PVR's of 500-800 cc's.  He is not having difficulty cathing.  Patient denies any gross hematuria, dysuria or suprapubic/flank pain.  Patient denies any fevers, chills, nausea or vomiting.    PMH: Past Medical History:  Diagnosis Date  . Bilateral kidney stones   . BPH (benign prostatic hypertrophy)   . Diabetes mellitus, type 2 ([place])   . Hematuria    gross  . HTN (hypertension)   . Urinary retention     Surgical History: Past Surgical History:  Procedure Laterality Date  . none      Home Medications:  Allergies as of 06/27/2019      Reactions   Codeine Nausea And Vomiting      Medication List       Accurate as of June 27, 2019  1:20 PM. If you have any questions, ask your nurse or doctor.        STOP taking these medications   glipiZIDE 5 MG tablet Commonly known as: GLUCOTROL Stopped by: Zara Council, PA-C     TAKE these medications   aluminum-magnesium hydroxide-simethicone [phone] MG/5ML Susp Commonly known as: MAALOX Take 30 mLs by mouth 4 (four) times daily -  before meals and at bedtime.   aspirin 81 MG tablet Take 81 mg by mouth daily.   famotidine 20 MG tablet Commonly known as: PEPCID Take 1 tablet (20 mg total) by mouth 2 (two) times  daily.   finasteride 5 MG tablet Commonly known as: PROSCAR Take 1 tablet (5 mg total) by mouth daily.   hydrochlorothiazide 25 MG tablet Commonly known as: HYDRODIURIL Take 25 mg by mouth daily.   HYDROcodone-acetaminophen 5-325 MG tablet Commonly known as: NORCO/VICODIN Take 1 tablet by mouth every 6 (six) hours as needed for moderate pain.   ibuprofen 600 MG tablet Commonly known as: ADVIL Take 1 tablet (600 mg total) by mouth every 8 (eight) hours as needed.   lisinopril 10 MG tablet Commonly known as: ZESTRIL Take 10 mg by mouth daily.   simvastatin 20 MG tablet  Commonly known as: ZOCOR Take 20 mg by mouth daily.   tamsulosin 0.4 MG Caps capsule Commonly known as: FLOMAX Take 1 capsule (0.4 mg total) by mouth daily.       Allergies:  Allergies  Allergen Reactions  . Codeine Nausea And Vomiting    Family History: Family History  Problem Relation Age of Onset  . Colon cancer Brother   . Cancer Unknown   . Prostate cancer Brother   . Lung cancer Father   . Kidney disease Neg Hx   . Bladder Cancer Neg Hx     Social History:  reports that he has quit smoking. He has never used smokeless tobacco. He reports that he does not drink alcohol or use drugs.  ROS: UROLOGY Frequent Urination?: No Hard to postpone urination?: No Burning/pain with urination?: No Get up at night to urinate?: No Leakage of urine?: No Urine stream starts and stops?: No Trouble starting stream?: No Do you have to strain to urinate?: No Blood in urine?: No Urinary tract infection?: No Sexually transmitted disease?: No Injury to kidneys or bladder?: No Painful intercourse?: No Weak stream?: Yes Erection problems?: No Penile pain?: No  Gastrointestinal Nausea?: No Vomiting?: No Indigestion/heartburn?: No Diarrhea?: No Constipation?: No  Constitutional Fever: No Night sweats?: No Weight loss?: Yes Fatigue?: No  Skin Skin rash/lesions?: No Itching?: No  Eyes Blurred vision?: No Double vision?: No  Ears/Nose/Throat Sore throat?: No Sinus problems?: No  Hematologic/Lymphatic Swollen glands?: No Easy bruising?: No  Cardiovascular Leg swelling?: No Chest pain?: No  Respiratory Cough?: No Shortness of breath?: No  Endocrine Excessive thirst?: No  Musculoskeletal Back pain?: No Joint pain?: No  Neurological Headaches?: No Dizziness?: No  Psychologic Depression?: No Anxiety?: No  Physical Exam: BP (!) 95/59   Pulse 87   Ht '5\' 5"'$  (1.651 m)   Wt 145 lb (65.8 kg)   BMI 24.13 kg/m   Constitutional:  Well nourished.  Alert and oriented, No acute distress. HEENT: [place] AT, moist mucus membranes.  Trachea midline, no masses. Cardiovascular: No clubbing, cyanosis, or edema. Respiratory: Normal respiratory effort, no increased work of breathing. Neurologic: Grossly intact, no focal deficits, moving all 4 extremities. Psychiatric: Normal mood and affect.  Laboratory Data: PSA Trend  3.1 in 11/2014 - started on finasteride  0.2 (0.4) in 05/2016  0.1 (0.2) in 12/2016  0.2 (0.4) in 06/2017  0.1 (0.2) in 12/2017  0.1 (0.2) in 06/2018             0.4 (0.8) in 10/2018 Lab Results  Component Value Date   CREATININE 1.09 02/16/2019   I have reviewed the labs.    Assessment & Plan:    1. History of urinary retention Recent serum creatinine 1.09 No hydro on CTU in 06/2018 Continue tamsulosin 0.4 mg and finasteride 5 mg daily Patient caths two times daily, indefinitely due to  urinary retention.   Patient is only cathing once daily - will increase to BID Advised to contact the office or seek treatment in the emergency department if he is unable to void RTC in one month for I PSS and PVR   Return in about 1 month (around 07/28/2019) for I PSS and PVR.  These notes generated with voice recognition software. I apologize for typographical errors.  Zara Council, PA-C  [facility] [address] [place], [place] [address] [phone]

## 2019-06-27 ENCOUNTER — Other Ambulatory Visit: Payer: Self-pay

## 2019-06-27 ENCOUNTER — Ambulatory Visit (INDEPENDENT_AMBULATORY_CARE_PROVIDER_SITE_OTHER): Payer: Medicare Other | Admitting: Urology

## 2019-06-27 ENCOUNTER — Encounter: Payer: Self-pay | Admitting: Urology

## 2019-06-27 VITALS — BP 95/59 | HR 87 | Ht 65.0 in | Wt 145.0 lb

## 2019-06-27 DIAGNOSIS — Z87898 Personal history of other specified conditions: Secondary | ICD-10-CM

## 2019-06-30 ENCOUNTER — Telehealth: Payer: Self-pay | Admitting: Urology

## 2019-06-30 NOTE — Telephone Encounter (Signed)
Pt would like something for nausea and also would like an RX called in for UTI because he knows he's going to get one from self cathing.

## 2019-06-30 NOTE — Telephone Encounter (Signed)
LMOM for patient to return call. Need to have clarification as to why he needs nausea medication. Those types of medications can not be taken for an extended period of time. He can only have RX for UTI when he has symptoms. He will need to come in for  A UA sample at if he has urinary symptoms.

## 2019-07-04 NOTE — Telephone Encounter (Signed)
Tried calling patient a second time. No answer

## 2019-08-01 ENCOUNTER — Encounter: Payer: Self-pay | Admitting: Emergency Medicine

## 2019-08-01 ENCOUNTER — Emergency Department: Payer: Medicare Other

## 2019-08-01 DIAGNOSIS — I10 Essential (primary) hypertension: Secondary | ICD-10-CM | POA: Diagnosis not present

## 2019-08-01 DIAGNOSIS — N5089 Other specified disorders of the male genital organs: Secondary | ICD-10-CM | POA: Diagnosis not present

## 2019-08-01 DIAGNOSIS — Z87891 Personal history of nicotine dependence: Secondary | ICD-10-CM | POA: Insufficient documentation

## 2019-08-01 DIAGNOSIS — Z7982 Long term (current) use of aspirin: Secondary | ICD-10-CM | POA: Insufficient documentation

## 2019-08-01 DIAGNOSIS — N50811 Right testicular pain: Secondary | ICD-10-CM | POA: Diagnosis present

## 2019-08-01 DIAGNOSIS — E119 Type 2 diabetes mellitus without complications: Secondary | ICD-10-CM | POA: Diagnosis not present

## 2019-08-01 DIAGNOSIS — N451 Epididymitis: Secondary | ICD-10-CM | POA: Insufficient documentation

## 2019-08-01 NOTE — ED Triage Notes (Signed)
Pt reports pain in groin while at work but reports pain has eased since. Pt denies swelling and sts he self caths self for urine due to enlarged bladder.

## 2019-08-02 ENCOUNTER — Emergency Department
Admission: EM | Admit: 2019-08-02 | Discharge: 2019-08-02 | Disposition: A | Discharge status: Home / Self Care | Payer: Medicare Other | Attending: Emergency Medicine | Admitting: Emergency Medicine

## 2019-08-02 ENCOUNTER — Telehealth: Payer: Self-pay | Admitting: Urology

## 2019-08-02 DIAGNOSIS — N451 Epididymitis: Secondary | ICD-10-CM

## 2019-08-02 DIAGNOSIS — R1030 Lower abdominal pain, unspecified: Secondary | ICD-10-CM

## 2019-08-02 LAB — URINALYSIS, COMPLETE (UACMP) WITH MICROSCOPIC
Bacteria, UA: NONE SEEN
Bilirubin Urine: NEGATIVE
Glucose, UA: NEGATIVE mg/dL
Ketones, ur: NEGATIVE mg/dL
Nitrite: NEGATIVE
Protein, ur: NEGATIVE mg/dL
Specific Gravity, Urine: 1.013 (ref 1.005–1.030)
WBC, UA: >50 WBC/hpf — ABNORMAL HIGH (ref 0–5)
pH: 6 (ref 5.0–8.0)

## 2019-08-02 LAB — BASIC METABOLIC PANEL
Anion gap: 12 (ref 5–15)
BUN: 21 mg/dL (ref 8–23)
CO2: 23 mmol/L (ref 22–32)
Calcium: 9.1 mg/dL (ref 8.9–10.3)
Chloride: 98 mmol/L (ref 98–111)
Creatinine, Ser: 1.02 mg/dL (ref 0.61–1.24)
GFR calc Af Amer: >60 mL/min (ref >60)
GFR calc non Af Amer: >60 mL/min (ref >60)
Glucose, Bld: 101 mg/dL — ABNORMAL HIGH (ref 70–99)
Potassium: 3.9 mmol/L (ref 3.5–5.1)
Sodium: 133 mmol/L — ABNORMAL LOW (ref 135–145)

## 2019-08-02 LAB — CBC WITH DIFFERENTIAL/PLATELET
Abs Immature Granulocytes: 0.06 10*3/uL (ref 0.00–0.07)
Basophils Absolute: 0.1 10*3/uL (ref 0.0–0.1)
Basophils Relative: 0 %
Eosinophils Absolute: 0.3 10*3/uL (ref 0.0–0.5)
Eosinophils Relative: 2 %
HCT: 40 % (ref 39.0–52.0)
Hemoglobin: 13.4 g/dL (ref 13.0–17.0)
Immature Granulocytes: 0 %
Lymphocytes Relative: 11 %
Lymphs Abs: 1.5 10*3/uL (ref 0.7–4.0)
MCH: 28.8 pg (ref 26.0–34.0)
MCHC: 33.5 g/dL (ref 30.0–36.0)
MCV: 85.8 fL (ref 80.0–100.0)
Monocytes Absolute: 1.2 10*3/uL — ABNORMAL HIGH (ref 0.1–1.0)
Monocytes Relative: 9 %
Neutro Abs: 10.5 10*3/uL — ABNORMAL HIGH (ref 1.7–7.7)
Neutrophils Relative %: 78 %
Platelets: 229 10*3/uL (ref 150–400)
RBC: 4.66 MIL/uL (ref 4.22–5.81)
RDW: 12.8 % (ref 11.5–15.5)
WBC: 13.7 10*3/uL — ABNORMAL HIGH (ref 4.0–10.5)
nRBC: 0 % (ref 0.0–0.2)

## 2019-08-02 LAB — LACTIC ACID, PLASMA: Lactic Acid, Venous: 1.8 mmol/L (ref 0.5–1.9)

## 2019-08-02 MED ORDER — SULFAMETHOXAZOLE-TRIMETHOPRIM 800-160 MG PO TABS: 1.0000 | ORAL_TABLET | Freq: Two times a day (BID) | ORAL | 0 refills | Status: AC

## 2019-08-02 MED ORDER — SODIUM CHLORIDE 0.9 % IV SOLN
1.0000 g | Freq: Once | INTRAVENOUS | Status: AC
  Administered 2019-08-02: 1 g via INTRAVENOUS
  Filled 2019-08-02: qty 10

## 2019-08-02 MED ORDER — ACETAMINOPHEN 500 MG PO TABS
1000.0000 mg | ORAL_TABLET | Freq: Once | ORAL | Status: AC
  Administered 2019-08-02: 1000 mg via ORAL
  Filled 2019-08-02: qty 2

## 2019-08-02 NOTE — Telephone Encounter (Signed)
Yes.  We can see him on the 14th.

## 2019-08-02 NOTE — Telephone Encounter (Signed)
He has an appt on 08/04/2019 for a 1 month follow up, Is this ok to follow up from ER visit?

## 2019-08-02 NOTE — ED Provider Notes (Signed)
[facility] Emergency Department Provider Note  ____________________________________________  Time seen: Approximately 2:01 AM  I have reviewed the triage vital signs and the nursing notes.   HISTORY  Chief Complaint Groin Pain   HPI Vincent Bautista is a 73 y.o. male with history of BPH requiring daily self-catheterization, kidney stones, hypertension who presents for evaluation of testicular pain.  Patient reports that the pain started this morning when he went to work.  The pain got progressively worse throughout the day.  The pain was achy, constant, located in his scrotum, nonradiating.  No abdominal pain, no nausea or vomiting, no dysuria, no hematuria, no flank pain, no fever or chills, no trauma.  Patient also noted some swelling of the left scrotum.   Past Medical History:  Diagnosis Date   Bilateral kidney stones    BPH (benign prostatic hypertrophy)    Diabetes mellitus, type 2 (HCC)    Hematuria    gross   HTN (hypertension)    Urinary retention     There are no active problems to display for this patient.   Past Surgical History:  Procedure Laterality Date   none      Prior to Admission medications   Medication Sig Start Date End Date Taking? Authorizing Provider  aluminum-magnesium hydroxide-simethicone (MAALOX) 200-200-20 MG/5ML SUSP Take 30 mLs by mouth 4 (four) times daily -  before meals and at bedtime. 08/15/18   Sharman CheekStafford, Phillip, MD  aspirin 81 MG tablet Take 81 mg by mouth daily.    [provider]  famotidine (PEPCID) 20 MG tablet Take 1 tablet (20 mg total) by mouth 2 (two) times daily. 08/15/18   Sharman CheekStafford, Phillip, MD  finasteride (PROSCAR) 5 MG tablet Take 1 tablet (5 mg total) by mouth daily. 11/15/18   Michiel CowboyMcGowan, Shannon A, PA-C  hydrochlorothiazide (HYDRODIURIL) 25 MG tablet Take 25 mg by mouth daily.  04/05/16   [provider]  HYDROcodone-acetaminophen (NORCO/VICODIN) 5-325 MG tablet Take 1 tablet by  mouth every 6 (six) hours as needed for moderate pain. 01/25/18   Jene EveryKinner, Robert, MD  ibuprofen (ADVIL,MOTRIN) 600 MG tablet Take 1 tablet (600 mg total) by mouth every 8 (eight) hours as needed. 12/12/16   Governor RooksLord, Rebecca, MD  lisinopril (PRINIVIL,ZESTRIL) 10 MG tablet Take 10 mg by mouth daily.  04/16/16   [provider]  simvastatin (ZOCOR) 20 MG tablet Take 20 mg by mouth daily.  04/05/16   [provider]  sulfamethoxazole-trimethoprim (BACTRIM DS) 800-160 MG tablet Take 1 tablet by mouth 2 (two) times daily for 10 days. 08/02/19 08/12/19  Nita SickleVeronese, [place], MD  tamsulosin (FLOMAX) 0.4 MG CAPS capsule Take 1 capsule (0.4 mg total) by mouth daily. 11/15/18   Michiel CowboyMcGowan, Shannon A, PA-C    Allergies Codeine  Family History  Problem Relation Age of Onset   Colon cancer Brother    Cancer Other    Prostate cancer Brother    Lung cancer Father    Kidney disease Neg Hx    Bladder Cancer Neg Hx     Social History Social History   Tobacco Use   Smoking status: Former Smoker   Smokeless tobacco: Never Used   Tobacco comment: quit 30 years  Substance Use Topics   Alcohol use: No    Alcohol/week: 0.0 standard drinks   Drug use: No    Review of Systems  Constitutional: Negative for fever. Eyes: Negative for visual changes. ENT: Negative for sore throat. Neck: No neck pain  Cardiovascular:  Negative for chest pain. Respiratory: Negative for shortness of breath. Gastrointestinal: Negative for abdominal pain, vomiting or diarrhea. Genitourinary: Negative for dysuria. + testicular pain Musculoskeletal: Negative for back pain. Skin: Negative for rash. Neurological: Negative for headaches, weakness or numbness. Psych: No SI or HI  ____________________________________________   PHYSICAL EXAM:  VITAL SIGNS: ED Triage Vitals  Enc Vitals Group     BP 08/01/19 2046 136/77     Pulse Rate 08/01/19 2046 (!) 110     Resp 08/01/19 2046 18     Temp 08/01/19 2046  99.7 F (37.6 C)     Temp Source 08/01/19 2046 Oral     SpO2 08/01/19 2046 99 %     Weight --      Height --      Head Circumference --      Peak Flow --      Pain Score 08/02/19 0055 0     Pain Loc --      Pain Edu? --      Excl. in [place]? --     Constitutional: Alert and oriented. Well appearing and in no apparent distress. HEENT:      Head: Normocephalic and atraumatic.         Eyes: Conjunctivae are normal. Sclera is non-icteric.       Mouth/Throat: Mucous membranes are moist.       Neck: Supple with no signs of meningismus. Cardiovascular: Regular rate and rhythm. No murmurs, gallops, or rubs. 2+ symmetrical distal pulses are present in all extremities. No JVD. Respiratory: Normal respiratory effort. Lungs are clear to auscultation bilaterally. No wheezes, crackles, or rhonchi.  Gastrointestinal: Soft, non tender, and non distended with positive bowel sounds. No rebound or guarding. GU: Bilateral testicles are descended, mild swelling of the R scrotum with no rash, no erythema, no crepitus, tender to palpation of the R testicle, bilateral positive cremasteric reflexes are present. No evidence of inguinal hernia. Musculoskeletal: Nontender with normal range of motion in all extremities. No edema, cyanosis, or erythema of extremities. Neurologic: Normal speech and language. Face is symmetric. Moving all extremities. No gross focal neurologic deficits are appreciated. Skin: Skin is warm, dry and intact. No rash noted. Psychiatric: Mood and affect are normal. Speech and behavior are normal.  ____________________________________________   LABS (all labs ordered are listed, but only abnormal results are displayed)  Labs Reviewed  URINALYSIS, COMPLETE (UACMP) WITH MICROSCOPIC - Abnormal; Notable for the following components:      Result Value   Color, Urine YELLOW (*)    APPearance CLOUDY (*)    Hgb urine dipstick SMALL (*)    Leukocytes,Ua LARGE (*)    WBC, UA >50 (*)    All  other components within normal limits  CBC WITH DIFFERENTIAL/PLATELET - Abnormal; Notable for the following components:   WBC 13.7 (*)    Neutro Abs 10.5 (*)    Monocytes Absolute 1.2 (*)    All other components within normal limits  BASIC METABOLIC PANEL - Abnormal; Notable for the following components:   Sodium 133 (*)    Glucose, Bld 101 (*)    All other components within normal limits  URINE CULTURE  LACTIC ACID, PLASMA   ____________________________________________  EKG  none  ____________________________________________  RADIOLOGY  I have personally reviewed the images performed during this visit and I agree with the Radiologist's read.   Interpretation by Radiologist:  US Scrotum W/doppler  Result Date: 08/01/2019 CLINICAL DATA:  Groin/bilateral testicular pain EXAM: SCROTAL ULTRASOUND  DOPPLER ULTRASOUND OF THE TESTICLES TECHNIQUE: Complete ultrasound examination of the testicles, epididymis, and other scrotal structures was performed. Color and spectral Doppler ultrasound were also utilized to evaluate blood flow to the testicles. COMPARISON:  None. FINDINGS: Right testicle Measurements: 3.0 x 1.9 x 2.6 cm. Extensive testicular microlithiasis. No mass is visualized. Left testicle Measurements: 3.3 x 2.0 x 2.5 cm. Extensive testicular microlithiasis. No mass is visualized. Right epididymis: Enlarged, heterogeneous, and hypervascular. 2.4 cm epididymal tail cyst. Left epididymis:  8 mm epididymal tail cyst. Hydrocele:  Moderate complex hydrocele on the right. Varicocele:  None visualized. Pulsed Doppler interrogation of both testes demonstrates normal low resistance arterial and venous waveforms bilaterally. IMPRESSION: Enlarged, heterogeneous right epididymis, suggesting epididymitis. Associated moderate complex right hydrocele. No evidence of testicular torsion. Electronically Signed   By: Charline BillsSriyesh  Krishnan M.D.   On: 08/01/2019 23:12       ____________________________________________   PROCEDURES  Procedure(s) performed: None Procedures Critical Care performed:  None ____________________________________________   INITIAL IMPRESSION / ASSESSMENT AND PLAN / ED COURSE  73 y.o. male with history of BPH requiring daily self-catheterization, kidney stones, hypertension who presents for evaluation of testicular pain.  On exam patient has mild swelling and tenderness palpation of the right testicle with no evidence of necrotizing infection or cellulitis.  Urinalysis negative for UTI however testicular ultrasound showing epididymitis with a complex right hydrocele.  Patient was given a dose of Rocephin.  Patient does have leukocytosis but no fever, normal lactic acid therefore no signs of sepsis.  He will be discharged home on Bactrim and follow-up with his urologist.  Patient has an appointment already scheduled in 2 days.  Discussed return precautions for new or worsening pain, swelling, dysuria, fever, vomiting, abdominal pain, flank pain.       As part of my medical decision making, I reviewed the following data within the electronic MEDICAL RECORD NUMBER Nursing notes reviewed and incorporated, Labs reviewed , Old chart reviewed, Radiograph reviewed , Notes from prior ED visits and [place] Controlled Substance Database   Patient was evaluated in Emergency Department today for the symptoms described in the history of present illness. Patient was evaluated in the context of the global COVID-19 pandemic, which necessitated consideration that the patient might be at risk for infection with the SARS-CoV-2 virus that causes COVID-19. Institutional protocols and algorithms that pertain to the evaluation of patients at risk for COVID-19 are in a state of rapid change based on information released by regulatory bodies including the CDC and federal and state organizations. These policies and algorithms were followed during the patient's care in the  ED.   ____________________________________________   FINAL CLINICAL IMPRESSION(S) / ED DIAGNOSES   Final diagnoses:  Epididymitis      NEW MEDICATIONS STARTED DURING THIS VISIT:  ED Discharge Orders         Ordered    sulfamethoxazole-trimethoprim (BACTRIM DS) 800-160 MG tablet  2 times daily     08/02/19 0206           Note:  This document was prepared using Dragon voice recognition software and may include unintentional dictation errors.    Don PerkingVeronese, [place], MD 08/02/19 [phone]

## 2019-08-02 NOTE — Telephone Encounter (Signed)
Vincent Bautista was seen in the ED yesterday for epididymitis. He will need a follow up appointment next week.

## 2019-08-03 LAB — URINE CULTURE: Culture: >=100000 — AB

## 2019-08-03 NOTE — Progress Notes (Signed)
10:09 PM   Vincent Bautista Vincent Bautista 03/06/1946 [id number]  Referring provider: Letta Median, MD [place],  [place] [id number]  Chief Complaint  Patient presents with  . Benign Prostatic Hypertrophy    HPI: Patient is a 73 year old male with BPH with retention, bladder diverticulum and a history of hematuria who presents for follow up after instruction in [facility].    History of urinary retention/ bladder diverticulum Patient was found to be in urinary retention after suffering a TIA in December 2015. Patient was initiated on finasteride and Flomax at that time.  He did undergo cystoscopy in June 2015 and was found to have an enlarged prostate, a bladder diverticulum with hypospadias.  He continues to have large residuals and has failed two voiding trials.  He does not have a good understanding of his situation as he continually asks for fluids pills to help him urinate.  Contrast CT in 01/2018 noted no adrenal lesions are appreciable. Kidneys bilaterally show no evident mass or hydronephrosis on either side.  There is a 1 mm calculus in the lower pole of the left kidney. No other intrarenal calculi are evident. There is no ureteral calculus on either side. Urinary bladder wall is not appreciably thickened.  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  CTU in 06/2018 revealed normal appearance of the adrenal glands. Two tiny stones are identified within the inferior pole collecting system of the left kidney measuring up to 2-3 mm, image 33/2 and image 32/2. No right renal calculi identified. No kidney mass or hydronephrosis identified bilaterally. Large volume urinary bladder is identified. The bladder measures approximately 15.2 x 8.9 by 10.7 cm (volume = 760 cm^3). Posterior bladder wall diverticulum is again noted measuring 3.9 cm, similar to previous exam. No suspicious filling defects identified within the bladder.   Prostate is unremarkable.  Most recent serum creatinine was 1.02 in 07/2019 with 70 eGFR.   Currently managed with [facility]IC.  PVR is 93 mL.  He is cathing twice daily.  He is using new catheter every time.    History of hematuria (high risk) Former smoker.  Prior hematuria work up in 2015 with CTU and cystoscopy noted bilateral punctate stones, enlarged prostate, a bladder diverticulum with hypospadias.   CTU in 06/2018 found no malignancies.  Cysto in 07/2018 revealed moderatetrabeculation; large posterior wall bladder diverticulum.  Urine cytology was negative.  He has no reports of gross hematuria.    Epididymitis Presented to the ED on 08/01/2019 with the complaint of right scrotal pain that started that morning associated with mild swelling.  Scrotal ultrasound revealed enlarged, heterogeneous right epididymis, suggesting epididymitis.  Associated moderate complex right hydrocele.  No evidence of testicular torsion.  Labs in the ED:  Serum creatinine 1.02, WBC count 13.7, UA positive for 6-10 RBC's and > 50 WBC's and urine culture was positive for E.coli resistant to ampicillin and ciprofloxacin.  Meds given in the ED: Septra DS BID x 10 days.  He states he is feeling better.    PMH: Past Medical History:  Diagnosis Date  . Bilateral kidney stones   . BPH (benign prostatic hypertrophy)   . Diabetes mellitus, type 2 ([place])   . Hematuria    gross  . HTN (hypertension)   . Urinary retention     Surgical History: Past Surgical History:  Procedure Laterality Date  . none      Home Medications:  Allergies as of 08/04/2019      Reactions   Codeine Nausea And Vomiting      Medication List       Accurate as of August 04, 2019 11:59 PM. If you have any questions, ask your nurse or doctor.        aluminum-magnesium hydroxide-simethicone 729-021-11 MG/5ML Susp Commonly known as: MAALOX Take 30 mLs by mouth 4 (four) times daily -  before meals and at bedtime.   aspirin 81 MG tablet Take  81 mg by mouth daily.   docusate sodium 100 MG capsule Commonly known as: Colace Take 1 capsule (100 mg total) by mouth 2 (two) times daily. Started by: Zara Council, PA-C   famotidine 20 MG tablet Commonly known as: PEPCID Take 1 tablet (20 mg total) by mouth 2 (two) times daily.   finasteride 5 MG tablet Commonly known as: PROSCAR Take 1 tablet (5 mg total) by mouth daily.   hydrochlorothiazide 25 MG tablet Commonly known as: HYDRODIURIL Take 25 mg by mouth daily.   HYDROcodone-acetaminophen 5-325 MG tablet Commonly known as: NORCO/VICODIN Take 1 tablet by mouth every 6 (six) hours as needed for moderate pain.   ibuprofen 600 MG tablet Commonly known as: ADVIL Take 1 tablet (600 mg total) by mouth every 8 (eight) hours as needed.   lisinopril 10 MG tablet Commonly known as: ZESTRIL Take 10 mg by mouth daily.   polyethylene glycol 17 g packet Commonly known as: MIRALAX / GLYCOLAX Take 17 g by mouth daily. Started by: Zara Council, PA-C   simvastatin 20 MG tablet Commonly known as: ZOCOR Take 20 mg by mouth daily.   sulfamethoxazole-trimethoprim 800-160 MG tablet Commonly known as: BACTRIM DS Take 1 tablet by mouth 2 (two) times daily for 10 days.   tamsulosin 0.4 MG Caps capsule Commonly known as: FLOMAX Take 1 capsule (0.4 mg total) by mouth daily.       Allergies:  Allergies  Allergen Reactions  . Codeine Nausea And Vomiting    Family History: Family History  Problem Relation Age of Onset  . Colon cancer Brother   . Cancer Other   . Prostate cancer Brother   . Lung cancer Father   . Kidney disease Neg Hx   . Bladder Cancer Neg Hx     Social History:  reports that he has quit smoking. He has never used smokeless tobacco. He reports that he does not drink alcohol or use drugs.  ROS: UROLOGY Frequent Urination?: No Hard to postpone urination?: No Burning/pain with urination?: No Get up at night to urinate?: No Leakage of urine?: No  Urine stream starts and stops?: No Trouble starting stream?: No Do you have to strain to urinate?: No Blood in urine?: No Urinary tract infection?: No Sexually transmitted disease?: No Injury to kidneys or bladder?: No Painful intercourse?: No Weak stream?: No Erection problems?: No Penile pain?: No  Gastrointestinal Nausea?: No Vomiting?: No Indigestion/heartburn?: No Diarrhea?: No Constipation?: Yes  Constitutional Fever: No Night sweats?: No Weight loss?: Yes Fatigue?: No  Skin Skin rash/lesions?: No Itching?: No  Eyes Blurred vision?: No Double vision?: No  Ears/Nose/Throat Sore throat?: No Sinus problems?: No  Hematologic/Lymphatic Swollen glands?: No Easy bruising?: No  Cardiovascular Leg swelling?: No Chest pain?: Yes  Respiratory Cough?: No Shortness of breath?: No  Endocrine Excessive thirst?: No  Musculoskeletal Back pain?: No Joint pain?: No  Neurological Headaches?: No Dizziness?: No  Psychologic Depression?: No Anxiety?: No  Physical Exam: BP (!) 95/59 (BP Location:  Left Arm, Patient Position: Sitting, Cuff Size: Normal)   Pulse 75   Ht _0  (1.651 m)   Wt 138 lb (62.6 kg)   BMI 22.96 kg/m   Constitutional:  Well nourished. Alert and oriented, No acute distress. HEENT: [place] AT, moist mucus membranes.  Trachea midline, no masses. Cardiovascular: No clubbing, cyanosis, or edema. Respiratory: Normal respiratory effort, no increased work of breathing. GI: Abdomen is soft, non tender, non distended, no abdominal masses. Liver and spleen not palpable.  No hernias appreciated.  Stool sample for occult testing is not indicated.   GU: No CVA tenderness.  No bladder fullness or masses.  Patient with uncircumcised phallus.  Foreskin easily retracted  Urethral meatus is patent.  No penile discharge. No penile lesions or rashes. Scrotum without lesions, cysts, rashes and/or edema.  Testicles are located scrotally bilaterally. No masses are  appreciated in the testicles. Left epididymis is normal.  Right epididymis is indurated and tender.   Rectal: Deferred Skin: No rashes, bruises or suspicious lesions. Lymph: No inguinal adenopathy. Neurologic: Grossly intact, no focal deficits, moving all 4 extremities. Psychiatric: Normal mood and affect.   Laboratory Data: PSA Trend  3.1 in 11/2014 - started on finasteride  0.2 (0.4) in 05/2016  0.1 (0.2) in 12/2016  0.2 (0.4) in 06/2017  0.1 (0.2) in 12/2017  0.1 (0.2) in 06/2018             0.4 (0.8) in 10/2018  0.3 (0.6) in 05/2019 Lab Results  Component Value Date   CREATININE 1.02 08/02/2019   I have reviewed the labs.    Pertinent Imaging CLINICAL DATA:  Groin/bilateral testicular pain  EXAM: SCROTAL ULTRASOUND  DOPPLER ULTRASOUND OF THE TESTICLES  TECHNIQUE: Complete ultrasound examination of the testicles, epididymis, and other scrotal structures was performed. Color and spectral Doppler ultrasound were also utilized to evaluate blood flow to the testicles.  COMPARISON:  None.  FINDINGS: Right testicle  Measurements: 3.0 x 1.9 x 2.6 cm. Extensive testicular microlithiasis. No mass is visualized.  Left testicle  Measurements: 3.3 x 2.0 x 2.5 cm. Extensive testicular microlithiasis. No mass is visualized.  Right epididymis: Enlarged, heterogeneous, and hypervascular. 2.4 cm epididymal tail cyst.  Left epididymis:  8 mm epididymal tail cyst.  Hydrocele:  Moderate complex hydrocele on the right.  Varicocele:  None visualized.  Pulsed Doppler interrogation of both testes demonstrates normal low resistance arterial and venous waveforms bilaterally.  IMPRESSION: Enlarged, heterogeneous right epididymis, suggesting epididymitis.  Associated moderate complex right hydrocele.  No evidence of testicular torsion.   Electronically Signed   By: Julian Hy M.D.   On: 08/01/2019 23:12 I have independently reviewed the films  and appreciate the findings of right epididymitis.    Assessment & Plan:    1. History of urinary retention Recent serum creatinine 1.02 No hydro on CTU in 06/2018 Continue tamsulosin 0.4 mg and finasteride 5 mg daily Patient caths two times daily, indefinitely due to urinary retention.   Advised to contact the office or seek treatment in the emergency department if he is unable to void RTC in one month for I PSS and PVR   2. Epididymitis Patient will continue the Septra DS RTC in one month for recheck   3. History of hematuria Hematuria work up completed in 07/2018 - findings positive for moderatetrabeculation; large posterior wall bladder diverticulum. No report of gross hematuria  RTC in one year for UA - patient to report any gross hematuria in the interim  Return in about 1 month (around 09/04/2019) for recheck .  These notes generated with voice recognition software. I apologize for typographical errors.  Zara Council, PA-C  [facility] [address] [place], [place] [address] [phone]

## 2019-08-04 ENCOUNTER — Encounter: Payer: Self-pay | Admitting: Urology

## 2019-08-04 ENCOUNTER — Other Ambulatory Visit: Payer: Self-pay

## 2019-08-04 ENCOUNTER — Ambulatory Visit (INDEPENDENT_AMBULATORY_CARE_PROVIDER_SITE_OTHER): Payer: Medicare Other | Admitting: Urology

## 2019-08-04 VITALS — BP 95/59 | HR 75 | Ht 65.0 in | Wt 138.0 lb

## 2019-08-04 DIAGNOSIS — Z87448 Personal history of other diseases of urinary system: Secondary | ICD-10-CM

## 2019-08-04 DIAGNOSIS — N451 Epididymitis: Secondary | ICD-10-CM | POA: Diagnosis not present

## 2019-08-04 DIAGNOSIS — Z87898 Personal history of other specified conditions: Secondary | ICD-10-CM

## 2019-08-04 LAB — BLADDER SCAN AMB NON-IMAGING: Scan Result: 93

## 2019-08-04 MED ORDER — DOCUSATE SODIUM 100 MG PO CAPS: 100.0000 mg | ORAL_CAPSULE | Freq: Two times a day (BID) | ORAL | 3 refills | Status: DC

## 2019-08-04 MED ORDER — POLYETHYLENE GLYCOL 3350 17 G PO PACK: 17.0000 g | PACK | Freq: Every day | ORAL | 3 refills | Status: DC

## 2019-08-14 ENCOUNTER — Other Ambulatory Visit: Payer: Self-pay

## 2019-08-14 DIAGNOSIS — Z20822 Contact with and (suspected) exposure to covid-19: Secondary | ICD-10-CM

## 2019-08-15 LAB — NOVEL CORONAVIRUS, NAA: SARS-CoV-2, NAA: NOT DETECTED

## 2019-08-31 ENCOUNTER — Encounter: Payer: Self-pay | Admitting: Podiatry

## 2019-08-31 ENCOUNTER — Other Ambulatory Visit: Payer: Self-pay

## 2019-08-31 ENCOUNTER — Ambulatory Visit (INDEPENDENT_AMBULATORY_CARE_PROVIDER_SITE_OTHER): Payer: Medicare Other | Admitting: Podiatry

## 2019-08-31 DIAGNOSIS — E119 Type 2 diabetes mellitus without complications: Secondary | ICD-10-CM | POA: Diagnosis not present

## 2019-08-31 DIAGNOSIS — M79675 Pain in left toe(s): Secondary | ICD-10-CM | POA: Diagnosis not present

## 2019-08-31 DIAGNOSIS — M79674 Pain in right toe(s): Secondary | ICD-10-CM

## 2019-08-31 DIAGNOSIS — B351 Tinea unguium: Secondary | ICD-10-CM | POA: Diagnosis not present

## 2019-08-31 NOTE — Progress Notes (Signed)
This patient presents to the office with chief complaint of long thick nails and diabetic feet.  This patient  says there  is  no pain and discomfort in their feet.  This patient says there are long thick painful nails.  These nails are painful walking and wearing shoes.  Patient has no history of infection or drainage from both feet.  Patient is unable to  self treat his own nails . This patient presents  to the office today for treatment of the  long nails and a foot evaluation due to history of  diabetes.  General Appearance  Alert, conversant and in no acute stress.  Vascular  Dorsalis pedis and posterior tibial  pulses are palpable  bilaterally.  Capillary return is within normal limits  bilaterally. Temperature is within normal limits  bilaterally.  Neurologic  Senn-Weinstein monofilament wire test within normal limits  bilaterally. Muscle power within normal limits bilaterally.  Nails Thick disfigured discolored nails with subungual debris  from hallux to fifth toes bilaterally. No evidence of bacterial infection or drainage bilaterally.  Orthopedic  No limitations of motion of motion feet .  No crepitus or effusions noted.  No bony pathology or digital deformities noted.  Skin  normotropic skin with no porokeratosis noted bilaterally.  No signs of infections or ulcers noted.     Onychomycosis  Diabetes with no foot complications  IE  Debride nails x 10.  A diabetic foot exam was performed and there is no evidence of any vascular or neurologic pathology.   RTC 3 months.   Dylana Shaw DPM  

## 2019-09-04 ENCOUNTER — Encounter: Payer: Self-pay | Admitting: Physician Assistant

## 2019-09-04 ENCOUNTER — Other Ambulatory Visit: Payer: Self-pay

## 2019-09-04 ENCOUNTER — Ambulatory Visit (INDEPENDENT_AMBULATORY_CARE_PROVIDER_SITE_OTHER): Payer: Medicare Other | Admitting: Physician Assistant

## 2019-09-04 VITALS — BP 108/67 | HR 98 | Ht 65.0 in | Wt 137.3 lb

## 2019-09-04 DIAGNOSIS — R300 Dysuria: Secondary | ICD-10-CM

## 2019-09-04 LAB — MICROSCOPIC EXAMINATION
RBC, Urine: NONE SEEN /hpf (ref 0–2)
WBC, UA: >30 /hpf — AB (ref 0–5)

## 2019-09-04 LAB — URINALYSIS, COMPLETE
Bilirubin, UA: NEGATIVE
Glucose, UA: NEGATIVE
Ketones, UA: NEGATIVE
Nitrite, UA: NEGATIVE
Specific Gravity, UA: 1.025 (ref 1.005–1.030)
Urobilinogen, Ur: 0.2 mg/dL (ref 0.2–1.0)
pH, UA: 6 (ref 5.0–7.5)

## 2019-09-04 MED ORDER — CEPHALEXIN 500 MG PO CAPS: 500.0000 mg | ORAL_CAPSULE | Freq: Two times a day (BID) | ORAL | 0 refills | Status: AC

## 2019-09-04 NOTE — Progress Notes (Signed)
09/04/2019 8:22 AM   Vincent LushEarl W Depierro 07-25-1946 [id number]  CC: Dysuria  HPI: Vincent Bautista is a 73 y.o. male who presents today for evaluation of possible UTI. He is an established [facility] patient who last saw Michiel CowboyShannon McGowan on 08/04/2019 for follow-up of CIC instruction. PMH significant for BPH with urinary retention, bladder diverticulum, and microscopic hematuria.  He reports a 2-day history of dysuria. He denies fevers, chills, nausea, vomiting, flank pain, and gross hematuria. He has taken Azo at home with adequate symptom palliation.  He was diagnosed with epididymitis in the ED approximately 1 month ago, urine culture with ampicillin and cipro-resistant E coli. He was treated with Bactrim and reported improvement in symptoms thereafter. No other history of recurrent UTI.  Patient CICs for management of BPH with urinary retention. He reports recently only cathing once daily and wonders if this may have contributed to his current infection. He states he is still taking tamsulosin and finasteride daily. He reports washing his hands prior to CIC and using a new catheter each time.  In-office UA today positive for 2+ blood, 2+ protein, and 2+ leukocyte esterase; urine microscopy with >30 WBCs/HPF and many bacteria.  PMH: Past Medical History:  Diagnosis Date  . Bilateral kidney stones   . BPH (benign prostatic hypertrophy)   . Diabetes mellitus, type 2 (HCC)   . Hematuria    gross  . HTN (hypertension)   . Urinary retention     Surgical History: Past Surgical History:  Procedure Laterality Date  . none      Home Medications:  Allergies as of 09/04/2019      Reactions   Codeine Nausea And Vomiting      Medication List       Accurate as of September 04, 2019 11:59 PM. If you have any questions, ask your nurse or doctor.        aluminum-magnesium hydroxide-simethicone 200-200-20 MG/5ML Susp Commonly known as: MAALOX Take 30 mLs by mouth 4 (four) times daily -  before  meals and at bedtime.   aspirin 81 MG tablet Take 81 mg by mouth daily.   cephALEXin 500 MG capsule Commonly known as: Keflex Take 1 capsule (500 mg total) by mouth 2 (two) times daily for 10 days. Started by: Carman ChingSamantha Mialee Weyman, PA-C   docusate sodium 100 MG capsule Commonly known as: Colace Take 1 capsule (100 mg total) by mouth 2 (two) times daily.   famotidine 20 MG tablet Commonly known as: PEPCID Take 1 tablet (20 mg total) by mouth 2 (two) times daily.   finasteride 5 MG tablet Commonly known as: PROSCAR Take 1 tablet (5 mg total) by mouth daily.   hydrochlorothiazide 25 MG tablet Commonly known as: HYDRODIURIL Take 25 mg by mouth daily.   HYDROcodone-acetaminophen 5-325 MG tablet Commonly known as: NORCO/VICODIN Take 1 tablet by mouth every 6 (six) hours as needed for moderate pain.   ibuprofen 600 MG tablet Commonly known as: ADVIL Take 1 tablet (600 mg total) by mouth every 8 (eight) hours as needed.   lisinopril 10 MG tablet Commonly known as: ZESTRIL Take 10 mg by mouth daily.   polyethylene glycol 17 g packet Commonly known as: MIRALAX / GLYCOLAX Take 17 g by mouth daily.   simvastatin 20 MG tablet Commonly known as: ZOCOR Take 20 mg by mouth daily.   tamsulosin 0.4 MG Caps capsule Commonly known as: FLOMAX Take 1 capsule (0.4 mg total) by mouth daily.  Allergies:  Allergies  Allergen Reactions  . Codeine Nausea And Vomiting    Family History: Family History  Problem Relation Age of Onset  . Colon cancer Brother   . Cancer Other   . Prostate cancer Brother   . Lung cancer Father   . Kidney disease Neg Hx   . Bladder Cancer Neg Hx     Social History:   reports that he has quit smoking. He has never used smokeless tobacco. He reports that he does not drink alcohol or use drugs.  ROS: UROLOGY Frequent Urination?: No Hard to postpone urination?: No Burning/pain with urination?: Yes Get up at night to urinate?: No Leakage  of urine?: No Urine stream starts and stops?: No Trouble starting stream?: Yes Do you have to strain to urinate?: No Blood in urine?: No Urinary tract infection?: Yes Sexually transmitted disease?: No Injury to kidneys or bladder?: No Painful intercourse?: No Weak stream?: Yes Erection problems?: No Penile pain?: No  Gastrointestinal Nausea?: No Vomiting?: No Indigestion/heartburn?: No Diarrhea?: No Constipation?: No  Constitutional Fever: No Night sweats?: No Weight loss?: Yes Fatigue?: No  Skin Skin rash/lesions?: No Itching?: No  Eyes Blurred vision?: No Double vision?: No  Ears/Nose/Throat Sore throat?: No Sinus problems?: No  Hematologic/Lymphatic Swollen glands?: No Easy bruising?: No  Cardiovascular Leg swelling?: No Chest pain?: No  Respiratory Cough?: No Shortness of breath?: No  Endocrine Excessive thirst?: No  Musculoskeletal Back pain?: No Joint pain?: No  Neurological Headaches?: No Dizziness?: No  Psychologic Depression?: No Anxiety?: No  Physical Exam: BP 108/67 (BP Location: Left Arm, Patient Position: Sitting, Cuff Size: Normal)   Pulse 98   Ht 5\' 5"  (1.651 m)   Wt 137 lb 4.8 oz (62.3 kg)   BMI 22.85 kg/m   Constitutional:  Alert and oriented, no acute distress, nontoxic appearing HEENT: [place], AT Cardiovascular: No clubbing, cyanosis, or edema Respiratory: Normal respiratory effort, no increased work of breathing Skin: No rashes, bruises or suspicious lesions Neurologic: Grossly intact, no focal deficits, moving all 4 extremities Psychiatric: Normal mood and affect  Laboratory Data: Results for orders placed or performed in visit on 09/04/19  Microscopic Examination   URINE  Result Value Ref Range   WBC, UA >30 (A) 0 - 5 /hpf   RBC None seen 0 - 2 /hpf   Epithelial Cells (non renal) 0-10 0 - 10 /hpf   Crystals Present (A) N/A   Crystal Type Amorphous Sediment N/A   Bacteria, UA Many (A) None seen/Few   Urinalysis, Complete  Result Value Ref Range   Specific Gravity, UA 1.025 1.005 - 1.030   pH, UA 6.0 5.0 - 7.5   Color, UA Yellow Yellow   Appearance Ur Cloudy (A) Clear   Leukocytes,UA 2+ (A) Negative   Protein,UA 2+ (A) Negative/Trace   Glucose, UA Negative Negative   Ketones, UA Negative Negative   RBC, UA 2+ (A) Negative   Bilirubin, UA Negative Negative   Urobilinogen, Ur 0.2 0.2 - 1.0 mg/dL   Nitrite, UA Negative Negative   Microscopic Examination See below:    Assessment & Plan:   1. Dysuria Patient with symptoms and UA consistent with acute cystitis. Will treat today consistent with last urine culture results from 1 month ago. I counseled the patient that urinary stasis can increase the risk of infection and that he should resume his recommended bidaily CIC schedule. He expressed understanding of this plan. - Urinalysis, Complete - CULTURE, URINE COMPREHENSIVE - cephALEXin (KEFLEX) 500 MG  capsule; Take 1 capsule (500 mg total) by mouth 2 (two) times daily for 10 days.  Dispense: 20 capsule; Refill: 0  Debroah Loop, PA-C  [facility] [address] [place], [place] [address] [phone]

## 2019-09-06 LAB — CULTURE, URINE COMPREHENSIVE

## 2019-09-06 NOTE — Progress Notes (Signed)
Called the patient to inform him that he will need to drop off another urine sample if he does not feel better on the Keflex I prescribed him 2 days ago. He states most of his dysuria has resolved and he is feeling better. I told him to call the clinic if he worsens or if his symptoms persist. He expressed understanding.

## 2019-09-14 NOTE — Progress Notes (Signed)
10:01 AM   Vincent Bautista 07/24/1946 [id number]  Referring provider: Letta Median, MD [place],  [place] [phone]  Chief Complaint  Patient presents with  . Bladder Prolapse    HPI: Patient is a 73 year old male with BPH with retention, bladder diverticulum and a history of hematuria who presents for follow up.  History of urinary retention/ bladder diverticulum Patient was found to be in urinary retention after suffering a TIA in December 2015. Patient was initiated on finasteride and Flomax at that time.  He did undergo cystoscopy in June 2015 and was found to have an enlarged prostate, a bladder diverticulum with hypospadias.  He continues to have large residuals and has failed two voiding trials.  He does not have a good understanding of his situation as he continually asks for fluids pills to help him urinate.  Contrast CT in 01/2018 noted no adrenal lesions are appreciable. Kidneys bilaterally show no evident mass or hydronephrosis on either side.  There is a 1 mm calculus in the lower pole of the left kidney. No other intrarenal calculi are evident. There is no ureteral calculus on either side. Urinary bladder wall is not appreciably thickened.  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  CTU in 06/2018 revealed normal appearance of the adrenal glands. Two tiny stones are identified within the inferior pole collecting system of the left kidney measuring up to 2-3 mm, image 33/2 and image 32/2. No right renal calculi identified. No kidney mass or hydronephrosis identified bilaterally. Large volume urinary bladder is identified. The bladder measures approximately 15.2 x 8.9 by 10.7 cm (volume = 760 cm^3). Posterior bladder wall diverticulum is again noted measuring 3.9 cm, similar to previous exam. No suspicious filling defects identified within the bladder.  Prostate is unremarkable.  Most recent  serum creatinine was 1.02 in 07/2019 with 70 eGFR.   Currently managed with CIC.  PVR is 123 mL.  He is cathing twice daily.  He is using new catheter every time.    History of hematuria (high risk) Former smoker.  Prior hematuria work up in 2015 with CTU and cystoscopy noted bilateral punctate stones, enlarged prostate, a bladder diverticulum with hypospadias.   CTU in 06/2018 found no malignancies.  Cysto in 07/2018 revealed moderatetrabeculation; large posterior wall bladder diverticulum. Urine cytology was negative.  He has no reports of gross hematuria.  His UA today was negative for AMH.    Epididymitis Presented to the ED on 08/01/2019 with the complaint of right scrotal pain that started that morning associated with mild swelling.  Scrotal ultrasound revealed enlarged, heterogeneous right epididymis, suggesting epididymitis.  Associated moderate complex right hydrocele.  No evidence of testicular torsion.  Labs in the ED:  Serum creatinine 1.02, WBC count 13.7, UA positive for 6-10 RBC's and > 50 WBC's and urine culture was positive for E.coli resistant to ampicillin and ciprofloxacin.  Meds given in the ED: Septra DS BID x 10 days.  He states he is feeling better.  He presented back to the office on 09/04/2019 for an UTI.  His UA at that time with > 30 WBC's and many bacteria.   He was given Kelfex.  Urine culture grew out multiple species.  He is asymptomatic at this visit.  Patient denies any gross hematuria, dysuria or suprapubic/flank pain.  Patient denies any fevers, chills, nausea or vomiting.     PMH: Past  Medical History:  Diagnosis Date  . Bilateral kidney stones   . BPH (benign prostatic hypertrophy)   . Diabetes mellitus, type 2 ([place])   . Hematuria    gross  . HTN (hypertension)   . Urinary retention     Surgical History: Past Surgical History:  Procedure Laterality Date  . none      Home Medications:  Allergies as of 09/15/2019      Reactions   Codeine Nausea And  Vomiting      Medication List       Accurate as of September 15, 2019 10:01 AM. If you have any questions, ask your nurse or doctor.        STOP taking these medications   aluminum-magnesium hydroxide-simethicone 161-096-04 MG/5ML Susp Commonly known as: MAALOX Stopped by: Zara Council, PA-C   HYDROcodone-acetaminophen 5-325 MG tablet Commonly known as: NORCO/VICODIN Stopped by: Zara Council, PA-C     TAKE these medications   aspirin 81 MG tablet Take 81 mg by mouth daily.   docusate sodium 100 MG capsule Commonly known as: Colace Take 1 capsule (100 mg total) by mouth 2 (two) times daily.   famotidine 20 MG tablet Commonly known as: PEPCID Take 1 tablet (20 mg total) by mouth 2 (two) times daily.   finasteride 5 MG tablet Commonly known as: PROSCAR Take 1 tablet (5 mg total) by mouth daily.   hydrochlorothiazide 25 MG tablet Commonly known as: HYDRODIURIL Take 25 mg by mouth daily.   ibuprofen 600 MG tablet Commonly known as: ADVIL Take 1 tablet (600 mg total) by mouth every 8 (eight) hours as needed.   lisinopril 10 MG tablet Commonly known as: ZESTRIL Take 10 mg by mouth daily.   polyethylene glycol 17 g packet Commonly known as: MIRALAX / GLYCOLAX Take 17 g by mouth daily.   simvastatin 20 MG tablet Commonly known as: ZOCOR Take 20 mg by mouth daily.   tamsulosin 0.4 MG Caps capsule Commonly known as: FLOMAX Take 1 capsule (0.4 mg total) by mouth daily.       Allergies:  Allergies  Allergen Reactions  . Codeine Nausea And Vomiting    Family History: Family History  Problem Relation Age of Onset  . Colon cancer Brother   . Cancer Other   . Prostate cancer Brother   . Lung cancer Father   . Kidney disease Neg Hx   . Bladder Cancer Neg Hx     Social History:  reports that he has quit smoking. He has never used smokeless tobacco. He reports that he does not drink alcohol or use drugs.  ROS: UROLOGY Frequent Urination?: No Hard  to postpone urination?: Yes Burning/pain with urination?: No Get up at night to urinate?: No Leakage of urine?: No Urine stream starts and stops?: No Trouble starting stream?: No Do you have to strain to urinate?: No Blood in urine?: No Urinary tract infection?: No Sexually transmitted disease?: No Injury to kidneys or bladder?: No Painful intercourse?: No Weak stream?: Yes Erection problems?: Yes Penile pain?: No  Gastrointestinal Nausea?: No Vomiting?: No Indigestion/heartburn?: No Diarrhea?: No Constipation?: No  Constitutional Fever: No Night sweats?: No Weight loss?: Yes Fatigue?: No  Skin Skin rash/lesions?: No Itching?: No  Eyes Blurred vision?: No Double vision?: No  Ears/Nose/Throat Sore throat?: No Sinus problems?: No  Hematologic/Lymphatic Swollen glands?: No Easy bruising?: No  Cardiovascular Leg swelling?: No Chest pain?: No  Respiratory Cough?: No Shortness of breath?: No  Endocrine Excessive thirst?: No  Musculoskeletal  Back pain?: No Joint pain?: No  Neurological Headaches?: No Dizziness?: No  Psychologic Depression?: No Anxiety?: No  Physical Exam: BP (!) 103/58 (BP Location: Left Arm, Patient Position: Sitting, Cuff Size: Normal)   Pulse 75   Ht 5' 5" (1.651 m)   Wt 141 lb (64 kg)   BMI 23.46 kg/m   Constitutional:  Well nourished. Alert and oriented, No acute distress. HEENT: [place] AT, moist mucus membranes.  Trachea midline, no masses. Cardiovascular: No clubbing, cyanosis, or edema. Respiratory: Normal respiratory effort, no increased work of breathing. Neurologic: Grossly intact, no focal deficits, moving all 4 extremities. Psychiatric: Normal mood and affect.   Laboratory Data: PSA Trend  3.1 in 11/2014 - started on finasteride  0.2 (0.4) in 05/2016  0.1 (0.2) in 12/2016  0.2 (0.4) in 06/2017  0.1 (0.2) in 12/2017  0.1 (0.2) in 06/2018             0.4 (0.8) in 10/2018  0.3 (0.6) in 05/2019 Lab Results   Component Value Date   CREATININE 1.02 08/02/2019   Urinalysis  Component     Latest Ref Rng & Units 09/15/2019  Specific Gravity, UA     1.005 - 1.030 1.025  pH, UA     5.0 - 7.5 6.0  Color, UA     Yellow Yellow  Appearance Ur     Clear Cloudy (A)  Leukocytes,UA     Negative Trace (A)  Protein,UA     Negative/Trace Negative  Glucose, UA     Negative Negative  Ketones, UA     Negative Negative  RBC, UA     Negative Trace (A)  Bilirubin, UA     Negative Negative  Urobilinogen, Ur     0.2 - 1.0 mg/dL 0.2  Nitrite, UA     Negative Negative  Microscopic Examination      See below:   Component     Latest Ref Rng & Units 09/15/2019  WBC, UA     0 - 5 /hpf >30 (A)  RBC     0 - 2 /hpf 0-2  Epithelial Cells (non renal)     0 - 10 /hpf 0-10  Bacteria, UA     None seen/Few Few   I have reviewed the labs.    Pertinent Imaging Results for SADAT, SLIWA (MRN [id number]) as of 09/15/2019 09:56  Ref. Range 09/15/2019 09:46  Scan Result Unknown [id number]    Assessment & Plan:    1. History of urinary retention Recent serum creatinine 1.02 No hydro on CTU in 06/2018 Continue tamsulosin 0.4 mg and finasteride 5 mg daily Patient caths two times daily, indefinitely due to urinary retention.   Advised to contact the office or seek treatment in the emergency department if he is unable to void RTC in one year for I PSS, PSA and PVR   2. Epididymitis Resolved   3. History of hematuria Hematuria work up completed in 07/2018 - findings positive for moderatetrabeculation; large posterior wall bladder diverticulum. No report of gross hematuria  UA today negative for AMH RTC in one year for UA - patient to report any gross hematuria in the interim     Return in about 1 year (around 09/14/2020) for IPSS, PSA, PVR and exam.  These notes generated with voice recognition software. I apologize for typographical errors.  Zara Council, PA-C  [facility] [address] [place], [place] [address] [phone]

## 2019-09-15 ENCOUNTER — Other Ambulatory Visit: Payer: Self-pay

## 2019-09-15 ENCOUNTER — Encounter: Payer: Self-pay | Admitting: Urology

## 2019-09-15 ENCOUNTER — Ambulatory Visit (INDEPENDENT_AMBULATORY_CARE_PROVIDER_SITE_OTHER): Payer: Medicare Other | Admitting: Urology

## 2019-09-15 VITALS — BP 103/58 | HR 75 | Ht 65.0 in | Wt 141.0 lb

## 2019-09-15 DIAGNOSIS — Z87898 Personal history of other specified conditions: Secondary | ICD-10-CM | POA: Diagnosis not present

## 2019-09-15 DIAGNOSIS — Z87448 Personal history of other diseases of urinary system: Secondary | ICD-10-CM | POA: Diagnosis not present

## 2019-09-15 DIAGNOSIS — N451 Epididymitis: Secondary | ICD-10-CM | POA: Diagnosis not present

## 2019-09-15 LAB — MICROSCOPIC EXAMINATION: WBC, UA: >30 /hpf — AB (ref 0–5)

## 2019-09-15 LAB — URINALYSIS, COMPLETE
Bilirubin, UA: NEGATIVE
Glucose, UA: NEGATIVE
Ketones, UA: NEGATIVE
Nitrite, UA: NEGATIVE
Protein,UA: NEGATIVE
Specific Gravity, UA: 1.025 (ref 1.005–1.030)
Urobilinogen, Ur: 0.2 mg/dL (ref 0.2–1.0)
pH, UA: 6 (ref 5.0–7.5)

## 2019-09-15 LAB — BLADDER SCAN AMB NON-IMAGING: Scan Result: 123

## 2019-09-28 ENCOUNTER — Other Ambulatory Visit: Payer: Self-pay

## 2019-09-28 DIAGNOSIS — Z20822 Contact with and (suspected) exposure to covid-19: Secondary | ICD-10-CM

## 2019-09-29 LAB — NOVEL CORONAVIRUS, NAA: SARS-CoV-2, NAA: NOT DETECTED

## 2019-10-02 ENCOUNTER — Telehealth: Payer: Self-pay | Admitting: General Practice

## 2019-10-02 NOTE — Telephone Encounter (Signed)
Negative COVID results given. Patient results "NOT Detected." Caller expressed understanding. ° °

## 2019-11-10 ENCOUNTER — Telehealth: Payer: Self-pay | Admitting: Urology

## 2019-11-10 NOTE — Telephone Encounter (Signed)
Patient notified and appointment has been scheduled 

## 2019-11-10 NOTE — Telephone Encounter (Signed)
It has been several years since we last prescribed the Cialis and my last note stated it was ineffective.  We should schedule either an appointment in the office or virtual to discuss his options for ED treatment further.

## 2019-11-10 NOTE — Telephone Encounter (Signed)
LMOM for patient to return call.

## 2019-11-10 NOTE — Telephone Encounter (Signed)
Pt called asking to speak to [facility] or one of the "nurses" about getting some more Cialis.  Please call pt at [phone]

## 2019-11-28 ENCOUNTER — Ambulatory Visit: Payer: Self-pay | Admitting: Urology

## 2019-11-30 ENCOUNTER — Ambulatory Visit: Payer: Medicare Other | Admitting: Podiatry

## 2019-12-02 ENCOUNTER — Other Ambulatory Visit: Payer: Self-pay | Admitting: Urology

## 2019-12-02 DIAGNOSIS — N401 Enlarged prostate with lower urinary tract symptoms: Secondary | ICD-10-CM

## 2019-12-28 ENCOUNTER — Other Ambulatory Visit: Payer: Medicare Other

## 2019-12-28 ENCOUNTER — Other Ambulatory Visit: Payer: Self-pay | Admitting: Family Medicine

## 2019-12-28 ENCOUNTER — Other Ambulatory Visit: Payer: Self-pay

## 2019-12-28 DIAGNOSIS — R300 Dysuria: Secondary | ICD-10-CM

## 2019-12-29 ENCOUNTER — Other Ambulatory Visit: Payer: Self-pay | Admitting: Urology

## 2019-12-29 LAB — MICROSCOPIC EXAMINATION
Epithelial Cells (non renal): >10 /hpf — AB (ref 0–10)
WBC, UA: >30 /hpf — AB (ref 0–5)

## 2019-12-29 LAB — URINALYSIS, COMPLETE
Bilirubin, UA: NEGATIVE
Glucose, UA: NEGATIVE
Ketones, UA: NEGATIVE
Nitrite, UA: NEGATIVE
Specific Gravity, UA: 1.02 (ref 1.005–1.030)
Urobilinogen, Ur: 0.2 mg/dL (ref 0.2–1.0)
pH, UA: 8 — ABNORMAL HIGH (ref 5.0–7.5)

## 2019-12-29 MED ORDER — SULFAMETHOXAZOLE-TRIMETHOPRIM 800-160 MG PO TABS: 1.0000 | ORAL_TABLET | Freq: Two times a day (BID) | ORAL | 0 refills | Status: DC

## 2019-12-29 NOTE — Progress Notes (Signed)
Please let Mr. Vincent Bautista know that I have sent a prescription in for Septra antibiotic to [facility].   Patient notified.

## 2019-12-31 LAB — CULTURE, URINE COMPREHENSIVE

## 2020-01-01 ENCOUNTER — Other Ambulatory Visit: Payer: Self-pay | Admitting: Family Medicine

## 2020-01-01 ENCOUNTER — Telehealth: Payer: Self-pay | Admitting: Family Medicine

## 2020-01-01 DIAGNOSIS — R300 Dysuria: Secondary | ICD-10-CM

## 2020-01-01 DIAGNOSIS — Z87448 Personal history of other diseases of urinary system: Secondary | ICD-10-CM

## 2020-01-01 NOTE — Telephone Encounter (Signed)
-----   Message from Harle Battiest, PA-C sent at 12/31/2019  6:46 PM EST ----- Please let Mr. Bistline know that his urine culture is positive for infection and to continue the Septra DS.

## 2020-01-01 NOTE — Telephone Encounter (Signed)
Patient notified and appointment for lab has been scheduled.

## 2020-01-12 ENCOUNTER — Other Ambulatory Visit: Payer: Self-pay

## 2020-01-12 ENCOUNTER — Other Ambulatory Visit: Payer: Medicare Other

## 2020-01-12 DIAGNOSIS — R300 Dysuria: Secondary | ICD-10-CM

## 2020-01-12 DIAGNOSIS — Z87448 Personal history of other diseases of urinary system: Secondary | ICD-10-CM

## 2020-01-12 LAB — URINALYSIS, COMPLETE
Bilirubin, UA: NEGATIVE
Glucose, UA: NEGATIVE
Ketones, UA: NEGATIVE
Nitrite, UA: NEGATIVE
Specific Gravity, UA: 1.02 (ref 1.005–1.030)
Urobilinogen, Ur: 0.2 mg/dL (ref 0.2–1.0)
pH, UA: 8.5 — ABNORMAL HIGH (ref 5.0–7.5)

## 2020-01-12 LAB — MICROSCOPIC EXAMINATION
RBC, Urine: NONE SEEN /hpf (ref 0–2)
WBC, UA: >30 /hpf — AB (ref 0–5)

## 2020-01-17 ENCOUNTER — Telehealth: Payer: Self-pay | Admitting: Family Medicine

## 2020-01-17 LAB — CULTURE, URINE COMPREHENSIVE

## 2020-01-17 NOTE — Telephone Encounter (Signed)
-----   Message from Harle Battiest, PA-C sent at 01/17/2020  2:08 PM EST ----- Please let Vincent Bautista know that his urine was contaminated.  If he is still having symptoms, I suggest coming to the office for a CATH UA and culture.  Otherwise, we will see him in the fall.

## 2020-01-17 NOTE — Telephone Encounter (Signed)
LMOM for patient to return call.

## 2020-01-18 NOTE — Telephone Encounter (Signed)
Patient was notified, he states he is doing better and not having symptoms at this time. He will keep follow up in fall and call if he has problems in the interim

## 2020-02-02 ENCOUNTER — Telehealth: Payer: Self-pay

## 2020-02-02 NOTE — Telephone Encounter (Signed)
Pt calls and states that he is out of catheters. He states that he has called his supplier who states that they are awaiting information from our office before sending cath supplies. I called [facility], who states that they need a prescription for the patient's catheters before they can mail them out. New RX needs to be faxed to [phone].

## 2020-02-02 NOTE — Telephone Encounter (Signed)
Form has been filled out and faxed.

## 2020-02-13 NOTE — Telephone Encounter (Signed)
Thayer Ohm from [employer] called the office today and left a voice mail message.  She states that she has faxed over a new order (with new qty)  for signature for catheter supplies for the patient.  (Reference [id number]).  She is checking on the status of the order to be signed and returned to fax # [phone].  She did not leave a phone number.

## 2020-02-14 NOTE — Telephone Encounter (Signed)
Faxed the prescription forms again for the catheter supplies.

## 2020-06-17 ENCOUNTER — Ambulatory Visit (INDEPENDENT_AMBULATORY_CARE_PROVIDER_SITE_OTHER): Payer: Medicare Other | Admitting: Podiatry

## 2020-06-17 ENCOUNTER — Other Ambulatory Visit: Payer: Self-pay

## 2020-06-17 ENCOUNTER — Encounter: Payer: Self-pay | Admitting: Podiatry

## 2020-06-17 DIAGNOSIS — B351 Tinea unguium: Secondary | ICD-10-CM

## 2020-06-17 DIAGNOSIS — M79675 Pain in left toe(s): Secondary | ICD-10-CM

## 2020-06-17 DIAGNOSIS — E119 Type 2 diabetes mellitus without complications: Secondary | ICD-10-CM

## 2020-06-17 DIAGNOSIS — M79674 Pain in right toe(s): Secondary | ICD-10-CM | POA: Diagnosis not present

## 2020-06-17 NOTE — Progress Notes (Signed)
This patient returns to my office for at risk foot care.  This patient requires this care by a professional since this patient will be at risk due to having diabetes.  This patient is unable to cut nails himself since the patient cannot reach his nails.These nails are painful walking and wearing shoes.  This patient presents for at risk foot care today.  Patient has not been seen in over 9 months.  General Appearance  Alert, conversant and in no acute stress.  Vascular  Dorsalis pedis and posterior tibial  pulses are palpable  bilaterally.  Capillary return is within normal limits  bilaterally. Temperature is within normal limits  bilaterally.  Neurologic  Senn-Weinstein monofilament wire test within normal limits  bilaterally. Muscle power within normal limits bilaterally.  Nails Thick disfigured discolored nails with subungual debris  from hallux to fifth toes bilaterally. No evidence of bacterial infection or drainage bilaterally.  Orthopedic  No limitations of motion  feet .  No crepitus or effusions noted.  No bony pathology or digital deformities noted.  Skin  normotropic skin with no porokeratosis noted bilaterally.  No signs of infections or ulcers noted.     Onychomycosis  Pain in right toes  Pain in left toes  Consent was obtained for treatment procedures.   Mechanical debridement of nails 1-5  bilaterally performed with a nail nipper.  Filed with dremel without incident.    Return office visit   3 months                  Told patient to return for periodic foot care and evaluation due to potential at risk complications.   Helane Gunther DPM

## 2020-07-03 ENCOUNTER — Other Ambulatory Visit: Payer: Self-pay | Admitting: Urology

## 2020-07-03 DIAGNOSIS — N401 Enlarged prostate with lower urinary tract symptoms: Secondary | ICD-10-CM

## 2020-08-15 ENCOUNTER — Other Ambulatory Visit: Payer: Self-pay | Admitting: Urology

## 2020-09-12 ENCOUNTER — Other Ambulatory Visit: Payer: Self-pay

## 2020-09-12 DIAGNOSIS — N401 Enlarged prostate with lower urinary tract symptoms: Secondary | ICD-10-CM

## 2020-09-13 ENCOUNTER — Other Ambulatory Visit: Payer: Medicare Other

## 2020-09-13 ENCOUNTER — Encounter: Payer: Self-pay | Admitting: Urology

## 2020-09-19 ENCOUNTER — Encounter: Payer: Self-pay | Admitting: Podiatry

## 2020-09-19 ENCOUNTER — Other Ambulatory Visit: Payer: Self-pay

## 2020-09-19 ENCOUNTER — Ambulatory Visit (INDEPENDENT_AMBULATORY_CARE_PROVIDER_SITE_OTHER): Payer: Medicare Other | Admitting: Podiatry

## 2020-09-19 DIAGNOSIS — M79674 Pain in right toe(s): Secondary | ICD-10-CM | POA: Diagnosis not present

## 2020-09-19 DIAGNOSIS — E119 Type 2 diabetes mellitus without complications: Secondary | ICD-10-CM | POA: Diagnosis not present

## 2020-09-19 DIAGNOSIS — B351 Tinea unguium: Secondary | ICD-10-CM | POA: Diagnosis not present

## 2020-09-19 DIAGNOSIS — M79675 Pain in left toe(s): Secondary | ICD-10-CM | POA: Diagnosis not present

## 2020-09-19 NOTE — Progress Notes (Signed)
This patient returns to my office for at risk foot care.  This patient requires this care by a professional since this patient will be at risk due to having  diabetes.  This patient is unable to cut nails himself since the patient cannot reach his nails.These nails are painful walking and wearing shoes.  This patient presents for at risk foot care today.  General Appearance  Alert, conversant and in no acute stress.  Vascular  Dorsalis pedis and posterior tibial  pulses are palpable  bilaterally.  Capillary return is within normal limits  bilaterally. Temperature is within normal limits  bilaterally.  Neurologic  Senn-Weinstein monofilament wire test within normal limits  bilaterally. Muscle power within normal limits bilaterally.  Nails Thick disfigured discolored nails with subungual debris  from hallux to fifth toes bilaterally. No evidence of bacterial infection or drainage bilaterally.  Orthopedic  No limitations of motion  feet .  No crepitus or effusions noted.  No bony pathology or digital deformities noted.  Skin  normotropic skin with no porokeratosis noted bilaterally.  No signs of infections or ulcers noted.     Onychomycosis  Pain in right toes  Pain in left toes  Consent was obtained for treatment procedures.   Mechanical debridement of nails 1-5  bilaterally performed with a nail nipper.  Filed with dremel without incident.    Return office visit   3 months                   Told patient to return for periodic foot care and evaluation due to potential at risk complications.   Ashyah Quizon DPM   

## 2020-09-19 NOTE — Progress Notes (Signed)
9:51 PM   Vincent Bautista [facility] 10-19-1946 [id number]  Referring provider: Oswaldo Conroy, MD [address] [place],  [place] [address]  Chief Complaint  Patient presents with  . 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  BPH with urinary retention/ bladder diverticulum Patient was found to be in urinary retention after suffering a TIA in December 2015. Currently managed with CIC.  He CIC x 2 daily.  He is using clean cath every time.  He is having burning with urination that started last week.    Hematuria (high risk) Former smoker.  Prior hematuria work up in 2015 with CTU and cystoscopy noted bilateral punctate stones, enlarged prostate, a bladder diverticulum with hypospadias.   CTU in 06/2018 found no malignancies.  Cysto in 07/2018 revealed moderatetrabeculation; large posterior wall bladder diverticulum. Urine cytology was negative.  He is not having gross hematuria.   His UA is negative for micro heme.    He states that he developed some burning after self cathing last week.   His UA is yellow cloudy with > 30 WBC's and many bacteria.  Patient denies any modifying or aggravating factors.  Patient denies any gross hematuria or suprapubic/flank pain.  Patient denies any fevers, chills, nausea or vomiting.   PMH: Past Medical History:  Diagnosis Date  . Bilateral kidney stones   . BPH (benign prostatic hypertrophy)   . Diabetes mellitus, type 2 (HCC)   . Hematuria    gross  . HTN (hypertension)   . Urinary retention     Surgical History: Past Surgical History:  Procedure Laterality Date  . none      Home Medications:  Allergies as of 09/20/2020      Reactions   Codeine Nausea And Vomiting      Medication List       Accurate as of September 20, 2020 11:59 PM. If you have any questions, ask your nurse or doctor.        aspirin  81 MG tablet Take 81 mg by mouth daily.   docusate sodium 100 MG capsule Commonly known as: Colace Take 1 capsule (100 mg total) by mouth 2 (two) times daily.   famotidine 20 MG tablet Commonly known as: PEPCID Take 1 tablet (20 mg total) by mouth 2 (two) times daily.   finasteride 5 MG tablet Commonly known as: PROSCAR Take 1 tablet (5 mg total) by mouth daily.   hydrochlorothiazide 25 MG tablet Commonly known as: HYDRODIURIL Take 25 mg by mouth daily.   ibuprofen 600 MG tablet Commonly known as: ADVIL Take 1 tablet (600 mg total) by mouth every 8 (eight) hours as needed.   lisinopril 10 MG tablet Commonly known as: ZESTRIL Take 10 mg by mouth daily.   polyethylene glycol 17 g packet Commonly known as: MIRALAX / GLYCOLAX Take 17 g by mouth daily.   simvastatin 20 MG tablet Commonly known as: ZOCOR Take 20 mg by mouth daily.   sulfamethoxazole-trimethoprim 800-160 MG tablet Commonly known as: BACTRIM DS Take 1 tablet by mouth every 12 (twelve) hours.   tamsulosin 0.4 MG Caps capsule Commonly known as: FLOMAX Take 1 capsule (0.4 mg total) by mouth daily.       Allergies:  Allergies  Allergen Reactions  . Codeine Nausea And Vomiting    Family History: Family History  Problem Relation Age  of Onset  . Colon cancer Brother   . Cancer Other   . Prostate cancer Brother   . Lung cancer Father   . Kidney disease Neg Hx   . Bladder Cancer Neg Hx     Social History:  reports that he has quit smoking. He has never used smokeless tobacco. He reports that he does not drink alcohol and does not use drugs.  ROS: For pertinent review of systems please refer to history of present illness  Physical Exam: BP (!) 162/80   Pulse 82   Ht 5\' 5"  (1.651 m)   Wt 140 lb (63.5 kg)   BMI 23.30 kg/m   Constitutional:  Well nourished. Alert and oriented, No acute distress. HEENT: [place] AT, mask in place.  Trachea midline Cardiovascular: No clubbing, cyanosis, or  edema. Respiratory: Normal respiratory effort, no increased work of breathing. GU: No CVA tenderness.  No bladder fullness or masses.  Patient with uncircumcised phallus.  Foreskin easily retracted  Urethral meatus is patent with coronal hypospadias.   No penile discharge. No penile lesions or rashes. Scrotum without lesions, cysts, rashes and/or edema.  Testicles are located scrotally bilaterally. No masses are appreciated in the testicles. Left and right epididymis are normal. Rectal: Patient with  normal sphincter tone. Anus and perineum without scarring or rashes. No rectal masses are appreciated. Prostate is approximately 60 + grams, could only palpate the apex and midportion of the gland, no nodules are appreciated. Seminal vesicles could not be palpated Skin: No rashes, bruises or suspicious lesions. Lymph: No inguinal adenopathy. Neurologic: Grossly intact, no focal deficits, moving all 4 extremities. Psychiatric: Normal mood and affect.   Laboratory Data: PSA Trend  3.1 in 11/2014 - started on finasteride   Component     Latest Ref Rng & Units 06/01/2016 12/23/2016 06/24/2017 12/27/2017  Prostate Specific Ag, Serum     0.0 - 4.0 ng/mL 0.2 0.1 0.2 0.1   Component     Latest Ref Rng & Units 07/04/2018 11/15/2018 06/16/2019  Prostate Specific Ag, Serum     0.0 - 4.0 ng/mL 0.1 0.4 0.3   Lab Results  Component Value Date   CREATININE 1.02 08/02/2019   Urinalysis  Component     Latest Ref Rng & Units 09/20/2020  Specific Gravity, UA     1.005 - 1.030 1.020  pH, UA     5.0 - 7.5 8.5 (H)  Color, UA     Yellow Yellow  Appearance Ur     Clear Cloudy (A)  Leukocytes,UA     Negative 2+ (A)  Protein,UA     Negative/Trace 2+ (A)  Glucose, UA     Negative Negative  Ketones, UA     Negative Negative  RBC, UA     Negative Trace (A)  Bilirubin, UA     Negative Negative  Urobilinogen, Ur     0.2 - 1.0 mg/dL 0.2  Nitrite, UA     Negative Negative  Microscopic Examination      See  below:   Component     Latest Ref Rng & Units 09/20/2020  WBC, UA     0 - 5 /hpf >30 (H)  RBC     0 - 2 /hpf None seen  Epithelial Cells (non renal)     0 - 10 /hpf 0-10  Bacteria, UA     None seen/Few Many (A)   I have reviewed the labs.    Pertinent Imaging No recent imaging  Assessment & Plan:    1. BPH with urinary retention I PSS score 1/1 Continue tamsulosin 0.4 mg and finasteride 5 mg daily Patient caths two times daily, indefinitely due to urinary retention.   Advised to contact the office or seek treatment in the emergency department if he is unable to void RTC in one year for I PSS, PSA and PVR - if today's PSA is stable   2. High risk hematuria Hematuria work up completed in 07/2018 - findings positive for moderatetrabeculation; large posterior wall bladder diverticulum. No report of gross hematuria  UA today negative for micro heme RTC in one year for UA - patient to report any gross hematuria in the interim    3. Dysuria UA suspicious for infection, will start Septra DS and adjust if necessary once sensitivities are available   Return in about 1 year (around 09/20/2021) for UA, PSA, IPSS and PVR .  These notes generated with voice recognition software. I apologize for typographical errors.  Michiel Cowboy, PA-C  [facility] [address] [place], [place] [address] [phone]

## 2020-09-20 ENCOUNTER — Encounter: Payer: Self-pay | Admitting: Urology

## 2020-09-20 ENCOUNTER — Ambulatory Visit (INDEPENDENT_AMBULATORY_CARE_PROVIDER_SITE_OTHER): Payer: Medicare Other | Admitting: Urology

## 2020-09-20 VITALS — BP 162/80 | HR 82 | Ht 65.0 in | Wt 140.0 lb

## 2020-09-20 DIAGNOSIS — R300 Dysuria: Secondary | ICD-10-CM | POA: Diagnosis not present

## 2020-09-20 DIAGNOSIS — R319 Hematuria, unspecified: Secondary | ICD-10-CM | POA: Diagnosis not present

## 2020-09-20 DIAGNOSIS — N401 Enlarged prostate with lower urinary tract symptoms: Secondary | ICD-10-CM

## 2020-09-20 MED ORDER — SULFAMETHOXAZOLE-TRIMETHOPRIM 800-160 MG PO TABS: 1.0000 | ORAL_TABLET | Freq: Two times a day (BID) | ORAL | 0 refills | Status: DC

## 2020-09-21 LAB — URINALYSIS, COMPLETE
Bilirubin, UA: NEGATIVE
Glucose, UA: NEGATIVE
Ketones, UA: NEGATIVE
Nitrite, UA: NEGATIVE
Specific Gravity, UA: 1.02 (ref 1.005–1.030)
Urobilinogen, Ur: 0.2 mg/dL (ref 0.2–1.0)
pH, UA: 8.5 — ABNORMAL HIGH (ref 5.0–7.5)

## 2020-09-21 LAB — MICROSCOPIC EXAMINATION
RBC, Urine: NONE SEEN /hpf (ref 0–2)
WBC, UA: >30 /hpf — ABNORMAL HIGH (ref 0–5)

## 2020-09-24 ENCOUNTER — Telehealth: Payer: Self-pay | Admitting: Urology

## 2020-09-24 NOTE — Telephone Encounter (Signed)
LMOM for patient to contact our office to schedule a lab appointment for PSA blood draw.

## 2020-09-24 NOTE — Telephone Encounter (Signed)
Please have Vincent Bautista return to the office for a PSA.  Either the lab did not draw PSA when he was here on Friday or the specimen has been lost.

## 2020-09-27 ENCOUNTER — Telehealth: Payer: Self-pay | Admitting: Family Medicine

## 2020-09-27 LAB — CULTURE, URINE COMPREHENSIVE

## 2020-09-27 NOTE — Telephone Encounter (Signed)
Unable to leave message, mailbox is full.

## 2020-09-27 NOTE — Telephone Encounter (Signed)
-----   Message from Harle Battiest, PA-C sent at 09/27/2020  8:14 AM EDT ----- Please let Mrs. Havlin know that his urine culture was positive for infection and that the Septra is the appropriate antibiotic.

## 2020-10-01 NOTE — Telephone Encounter (Signed)
2nd attempt 'mailbox full'

## 2020-10-06 ENCOUNTER — Telehealth: Payer: Self-pay | Admitting: Urology

## 2020-10-06 NOTE — Telephone Encounter (Signed)
Vincent Bautista PSA from the first still has not resulted.  Would you call [facility]?

## 2020-10-17 ENCOUNTER — Other Ambulatory Visit: Payer: Self-pay

## 2020-10-17 ENCOUNTER — Other Ambulatory Visit: Payer: Medicare Other

## 2020-10-17 DIAGNOSIS — N401 Enlarged prostate with lower urinary tract symptoms: Secondary | ICD-10-CM

## 2020-10-18 LAB — PSA: Prostate Specific Ag, Serum: 0.6 ng/mL (ref 0.0–4.0)

## 2020-10-21 ENCOUNTER — Telehealth: Payer: Self-pay | Admitting: *Deleted

## 2020-10-21 NOTE — Telephone Encounter (Signed)
-----   Message from Harle Battiest, PA-C sent at 10/18/2020  8:00 AM EDT ----- Please let Vincent Bautista know that his PSA is stable at 0.6.

## 2020-10-21 NOTE — Telephone Encounter (Signed)
Notified patient as instructed, patient pleased. Discussed follow-up appointments, patient agrees  

## 2020-10-24 ENCOUNTER — Other Ambulatory Visit: Payer: Self-pay | Admitting: Urology

## 2020-10-24 DIAGNOSIS — N401 Enlarged prostate with lower urinary tract symptoms: Secondary | ICD-10-CM

## 2020-12-19 ENCOUNTER — Ambulatory Visit: Payer: Medicare Other | Admitting: Podiatry

## 2021-02-17 ENCOUNTER — Other Ambulatory Visit: Payer: Self-pay | Admitting: Urology

## 2021-05-29 ENCOUNTER — Other Ambulatory Visit: Payer: Self-pay | Admitting: Urology

## 2021-07-03 ENCOUNTER — Encounter: Payer: Self-pay | Admitting: Podiatry

## 2021-07-03 ENCOUNTER — Ambulatory Visit (INDEPENDENT_AMBULATORY_CARE_PROVIDER_SITE_OTHER): Payer: Medicare Other | Admitting: Podiatry

## 2021-07-03 ENCOUNTER — Other Ambulatory Visit: Payer: Self-pay

## 2021-07-03 DIAGNOSIS — M79675 Pain in left toe(s): Secondary | ICD-10-CM

## 2021-07-03 DIAGNOSIS — B351 Tinea unguium: Secondary | ICD-10-CM | POA: Diagnosis not present

## 2021-07-03 DIAGNOSIS — M79674 Pain in right toe(s): Secondary | ICD-10-CM | POA: Diagnosis not present

## 2021-07-03 DIAGNOSIS — E119 Type 2 diabetes mellitus without complications: Secondary | ICD-10-CM

## 2021-07-03 NOTE — Progress Notes (Signed)
This patient returns to my office for at risk foot care.  This patient requires this care by a professional since this patient will be at risk due to having diabetes.  This patient is unable to cut nails himself since the patient cannot reach his nails.These nails are painful walking and wearing shoes. Patient has not been seen in over 9 months. This patient presents for at risk foot care today.    General Appearance  Alert, conversant and in no acute stress.  Vascular  Dorsalis pedis and posterior tibial  pulses are palpable  bilaterally.  Capillary return is within normal limits  bilaterally. Temperature is within normal limits  bilaterally.  Neurologic  Senn-Weinstein monofilament wire test within normal limits  bilaterally. Muscle power within normal limits bilaterally.  Nails Thick disfigured discolored nails with subungual debris  from hallux to fifth toes bilaterally. No evidence of bacterial infection or drainage bilaterally.  Orthopedic  No limitations of motion  feet .  No crepitus or effusions noted.  No bony pathology or digital deformities noted.  Skin  normotropic skin with no porokeratosis noted bilaterally.  No signs of infections or ulcers noted.     Onychomycosis  Pain in right toes  Pain in left toes  Consent was obtained for treatment procedures.   Mechanical debridement of nails 1-5  bilaterally performed with a nail nipper.  Filed with dremel without incident.    Return office visit   3 months                  Told patient to return for periodic foot care and evaluation due to potential at risk complications.   Helane Gunther DPM

## 2021-08-27 ENCOUNTER — Other Ambulatory Visit: Payer: Self-pay | Admitting: Urology

## 2021-09-18 ENCOUNTER — Other Ambulatory Visit: Payer: Self-pay

## 2021-09-18 DIAGNOSIS — N401 Enlarged prostate with lower urinary tract symptoms: Secondary | ICD-10-CM

## 2021-09-19 ENCOUNTER — Other Ambulatory Visit: Payer: Medicare Other

## 2021-09-19 ENCOUNTER — Other Ambulatory Visit: Payer: Self-pay

## 2021-09-19 DIAGNOSIS — N401 Enlarged prostate with lower urinary tract symptoms: Secondary | ICD-10-CM

## 2021-09-20 LAB — PSA: Prostate Specific Ag, Serum: 1.1 ng/mL (ref 0.0–4.0)

## 2021-09-25 NOTE — Progress Notes (Signed)
2:54 PM   Vincent Bautista [facility] [birth year] [id number]  Referring provider: Oswaldo Conroy, MD [address] [place],  [place] [address]  Chief Complaint  Patient presents with   Benign Prostatic Hypertrophy    Urological history: 1. BPH with retention -PSA 1.1 in 08/2021 -I PSS 7/3  -managed with CIC x 2 daily  -still voids spontaneously   2. 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 High risk hematuria -former smoker -CTU and cysto 2015 - bilateral punctate stones, enlarged prostate, a bladder diverticulum with hypospadias -CTU and cysto 2019 - bilateral punctate stones, moderate trabeculation; large posterior wall bladder diverticulum. Urine cytology was negative -no reports of gross hematuria -UA negative for micro heme  4. Nephrolithiasis -bilateral punctate stones CTU 2019  5.  Epididymitis -Last occurrence 2020  6. ED -contributing factors of age, HTN, diabetes, BPH and smoking history -failed PDE5i's    HPI: Vincent Bautista is a 75 y.o. male who presents today for yearly follow up.    He has started to experience some dysuria over the last few days.  Patient denies any modifying or aggravating factors.  Patient denies any gross hematuria, dysuria or suprapubic/flank pain.  Patient denies any fevers, chills, nausea or vomiting.    UA > 30 WBC's and many bacteria.    IPSS     Row Name 09/26/21 0900         International Prostate Symptom Score   How often have you had the sensation of not emptying your bladder? Less than half the time     How often have you had to urinate less than every two hours? Less than half the time     How often have you found it difficult to postpone urination? Not at All     How often have you had a weak urinary stream? About half the time     How often have you had to strain to start urination? Not at All     How many times did you typically get up at night to urinate? None      Total IPSS Score 7       Quality of Life due to urinary symptoms   If you were to spend the rest of your life with your urinary condition just the way it is now how would you feel about that? Mixed              Score:  1-7 Mild 8-19 Moderate 20-35 Severe    PMH: Past Medical History:  Diagnosis Date   Bilateral kidney stones    BPH (benign prostatic hypertrophy)    Diabetes mellitus, type 2 (HCC)    Hematuria    gross   HTN (hypertension)    Urinary retention     Surgical History: Past Surgical History:  Procedure Laterality Date   none      Home Medications:  Allergies as of 09/26/2021       Reactions   Codeine Nausea And Vomiting        Medication List        Accurate as of September 26, 2021 11:59 PM. If you have any questions, ask your nurse or doctor.          aspirin 81 MG tablet Take 81 mg by mouth daily.   docusate sodium 100 MG capsule Commonly known as: Colace Take 1 capsule (100 mg total) by mouth 2 (two) times daily.  famotidine 20 MG tablet Commonly known as: PEPCID Take 1 tablet (20 mg total) by mouth 2 (two) times daily.   finasteride 5 MG tablet Commonly known as: PROSCAR Take 1 tablet (5 mg total) by mouth daily.   hydrochlorothiazide 25 MG tablet Commonly known as: HYDRODIURIL Take 25 mg by mouth daily.   ibuprofen 600 MG tablet Commonly known as: ADVIL Take 1 tablet (600 mg total) by mouth every 8 (eight) hours as needed.   lisinopril 10 MG tablet Commonly known as: ZESTRIL Take 10 mg by mouth daily.   polyethylene glycol 17 g packet Commonly known as: MIRALAX / GLYCOLAX Take 17 g by mouth daily.   simvastatin 20 MG tablet Commonly known as: ZOCOR Take 20 mg by mouth daily.   sulfamethoxazole-trimethoprim 800-160 MG tablet Commonly known as: BACTRIM DS Take 1 tablet by mouth every 12 (twelve) hours.   tamsulosin 0.4 MG Caps capsule Commonly known as: FLOMAX Take 1 capsule (0.4 mg total) by mouth daily.         Allergies:  Allergies  Allergen Reactions   Codeine Nausea And Vomiting    Family History: Family History  Problem Relation Age of Onset   Colon cancer Brother    Cancer Other    Prostate cancer Brother    Lung cancer Father    Kidney disease Neg Hx    Bladder Cancer Neg Hx     Social History:  reports that he has quit smoking. He has never used smokeless tobacco. He reports that he does not drink alcohol and does not use drugs.  ROS: For pertinent review of systems please refer to history of present illness  Physical Exam: BP (!) 143/73   Pulse 75   Ht 5\' 5"  (1.651 m)   Wt 140 lb (63.5 kg)   BMI 23.30 kg/m   Constitutional:  Well nourished. Alert and oriented, No acute distress. HEENT: [place] AT, mask in place.  Trachea midline Cardiovascular: No clubbing, cyanosis, or edema. Respiratory: Normal respiratory effort, no increased work of breathing. GU: No CVA tenderness.  No bladder fullness or masses.  Patient with uncircumcised phallus. Foreskin easily retracted  Urethral meatus is patent.  Coronal hypospadies is noted. No penile discharge. No penile lesions or rashes. Scrotum without lesions, cysts, rashes and/or edema.  Testicles are located scrotally bilaterally. No masses are appreciated in the testicles. Left and right epididymis are normal. Rectal: Patient with  normal sphincter tone. Anus and perineum without scarring or rashes. No rectal masses are appreciated. Prostate is approximately 60 + grams, could only palpate the apex, no nodules are appreciated. Seminal vesicles could not be palpated Neurologic: Grossly intact, no focal deficits, moving all 4 extremities. Psychiatric: Normal mood and affect.   Laboratory Data: Component     Latest Ref Rng & Units 09/19/2021  Prostate Specific Ag, Serum     0.0 - 4.0 ng/mL 1.1    Urinalysis  Component     Latest Ref Rng & Units 09/26/2021  Specific Gravity, UA     1.005 - 1.030 1.020  pH, UA     5.0 - 7.5 8.0  (H)  Color, UA     Yellow Straw  Appearance Ur     Clear Cloudy (A)  Leukocytes,UA     Negative 3+ (A)  Protein,UA     Negative/Trace 1+ (A)  Glucose, UA     Negative Negative  Ketones, UA     Negative Negative  RBC, UA     Negative  Trace (A)  Bilirubin, UA     Negative Negative  Urobilinogen, Ur     0.2 - 1.0 mg/dL 0.2  Nitrite, UA     Negative Negative  Microscopic Examination      See below:   Component     Latest Ref Rng & Units 09/26/2021  WBC, UA     0 - 5 /hpf >30 (A)  RBC     0 - 2 /hpf 0-2  Epithelial Cells (non renal)     0 - 10 /hpf 0-10  Crystals     N/A Present (A)  Crystal Type     N/A Triple Phosphate  Bacteria, UA     None seen/Few Many (A)  I have reviewed the labs.    Pertinent Imaging No recent imaging   Assessment & Plan:    1. BPH with LUTS -PSA stable -DRE benign -UA benign -continue conservative management, avoiding bladder irritants and timed voiding's -Continue tamsulosin 0.4 mg daily and finasteride 5 mg daily-refills given  2. High risk hematuria -Hematuria work up completed in 07/2018 - findings positive for moderate trabeculation; large posterior wall bladder diverticulum. -No report of gross hematuria  -UA today negative for micro heme  3. Dysuria -UA suspicious for infection, will start Septra DS and adjust if necessary once sensitivities are available   Return in about 1 year (around 09/26/2022) for IPSS, PSA, PVR and exam.  These notes generated with voice recognition software. I apologize for typographical errors.  Michiel Cowboy, PA-C  [facility] [address] [place], [place] [address] [phone]

## 2021-09-26 ENCOUNTER — Ambulatory Visit (INDEPENDENT_AMBULATORY_CARE_PROVIDER_SITE_OTHER): Payer: Medicare Other | Admitting: Urology

## 2021-09-26 ENCOUNTER — Encounter: Payer: Self-pay | Admitting: Urology

## 2021-09-26 ENCOUNTER — Other Ambulatory Visit: Payer: Self-pay

## 2021-09-26 VITALS — BP 143/73 | HR 75 | Ht 65.0 in | Wt 140.0 lb

## 2021-09-26 DIAGNOSIS — N401 Enlarged prostate with lower urinary tract symptoms: Secondary | ICD-10-CM | POA: Diagnosis not present

## 2021-09-26 DIAGNOSIS — R319 Hematuria, unspecified: Secondary | ICD-10-CM | POA: Diagnosis not present

## 2021-09-26 DIAGNOSIS — R300 Dysuria: Secondary | ICD-10-CM

## 2021-09-26 LAB — URINALYSIS, COMPLETE
Bilirubin, UA: NEGATIVE
Glucose, UA: NEGATIVE
Ketones, UA: NEGATIVE
Nitrite, UA: NEGATIVE
Specific Gravity, UA: 1.02 (ref 1.005–1.030)
Urobilinogen, Ur: 0.2 mg/dL (ref 0.2–1.0)
pH, UA: 8 — ABNORMAL HIGH (ref 5.0–7.5)

## 2021-09-26 LAB — MICROSCOPIC EXAMINATION: WBC, UA: >30 /hpf — AB (ref 0–5)

## 2021-09-26 MED ORDER — FINASTERIDE 5 MG PO TABS: 5.0000 mg | ORAL_TABLET | Freq: Every day | ORAL | 3 refills | Status: DC

## 2021-09-26 MED ORDER — TAMSULOSIN HCL 0.4 MG PO CAPS: 0.4000 mg | ORAL_CAPSULE | Freq: Every day | ORAL | 3 refills | Status: DC

## 2021-09-26 MED ORDER — SULFAMETHOXAZOLE-TRIMETHOPRIM 800-160 MG PO TABS: 1.0000 | ORAL_TABLET | Freq: Two times a day (BID) | ORAL | 0 refills | Status: DC

## 2021-09-29 LAB — CULTURE, URINE COMPREHENSIVE

## 2021-10-09 ENCOUNTER — Ambulatory Visit: Payer: Medicare Other | Admitting: Podiatry

## 2021-12-18 ENCOUNTER — Other Ambulatory Visit: Payer: Self-pay | Admitting: Urology

## 2021-12-18 DIAGNOSIS — R300 Dysuria: Secondary | ICD-10-CM

## 2022-01-01 NOTE — Progress Notes (Signed)
10:02 AM   Vincent Bautista 1946/11/16 [id number]  Referring provider: Letta Median, MD [place],   [address]  Chief Complaint  Patient presents with   Benign Prostatic Hypertrophy   Hematuria   Urological history: 1. BPH with retention -PSA 1.1 in 08/2021 -I PSS 4/1 -managed with CIC x 2 daily  -still voids spontaneously   2. 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 High risk hematuria -former smoker -CTU and cysto 2015 - bilateral punctate stones, enlarged prostate, a bladder diverticulum with hypospadias -CTU and cysto 2019 - bilateral punctate stones, moderate trabeculation; large posterior wall bladder diverticulum. Urine cytology was negative -no reports of gross hematuria -UA negative for microscopic hematuria  4. Nephrolithiasis -bilateral punctate stones CTU 2019  5.  Epididymitis -Last occurrence 2020  6. ED -contributing factors of age, HTN, diabetes, BPH and smoking history -failed PDE5i's    HPI: Vincent Bautista is a 76 y.o. male who presents today for medication refills.   UA nitrate positive, 11-30 WBCs and many bacteria.  He has been experiencing dysuria that started last week.    Patient denies any modifying or aggravating factors.  Patient denies any gross hematuria or suprapubic/flank pain.  Patient denies any fevers, chills, nausea or vomiting.    He is also having retrograde ejaculation.   He also admits that when he is prescribed an antibiotic he does not finish the entire prescription.   IPSS     Row Name 01/02/22 0900         International Prostate Symptom Score   How often have you had the sensation of not emptying your bladder? Less than 1 in 5     How often have you had to urinate less than every two hours? Less than 1 in 5 times     How often have you found you stopped and started again several times when you urinated? Not at All     How often have  you found it difficult to postpone urination? Less than 1 in 5 times     How often have you had a weak urinary stream? Less than 1 in 5 times     How often have you had to strain to start urination? Not at All     How many times did you typically get up at night to urinate? None     Total IPSS Score 4       Quality of Life due to urinary symptoms   If you were to spend the rest of your life with your urinary condition just the way it is now how would you feel about that? Pleased               Score:  1-7 Mild 8-19 Moderate 20-35 Severe    PMH: Past Medical History:  Diagnosis Date   Bilateral kidney stones    BPH (benign prostatic hypertrophy)    Diabetes mellitus, type 2 (HCC)    Hematuria    gross   HTN (hypertension)    Urinary retention     Surgical History: Past Surgical History:  Procedure Laterality Date   none      Home Medications:  Allergies as of 01/02/2022       Reactions   Codeine Nausea And Vomiting        Medication List        Accurate as of January 02, 2022 10:02 AM. If  you have any questions, ask your nurse or doctor.          aspirin 81 MG tablet Take 81 mg by mouth daily.   docusate sodium 100 MG capsule Commonly known as: Colace Take 1 capsule (100 mg total) by mouth 2 (two) times daily.   famotidine 20 MG tablet Commonly known as: PEPCID Take 1 tablet (20 mg total) by mouth 2 (two) times daily.   finasteride 5 MG tablet Commonly known as: PROSCAR Take 1 tablet (5 mg total) by mouth daily.   hydrochlorothiazide 25 MG tablet Commonly known as: HYDRODIURIL Take 25 mg by mouth daily.   ibuprofen 600 MG tablet Commonly known as: ADVIL Take 1 tablet (600 mg total) by mouth every 8 (eight) hours as needed.   lisinopril 10 MG tablet Commonly known as: ZESTRIL Take 10 mg by mouth daily.   polyethylene glycol 17 g packet Commonly known as: MIRALAX / GLYCOLAX Take 17 g by mouth daily.   simvastatin 20 MG  tablet Commonly known as: ZOCOR Take 20 mg by mouth daily.   sulfamethoxazole-trimethoprim 800-160 MG tablet Commonly known as: BACTRIM DS Take 1 tablet by mouth every 12 (twelve) hours.   tamsulosin 0.4 MG Caps capsule Commonly known as: FLOMAX Take 1 capsule (0.4 mg total) by mouth daily.        Allergies:  Allergies  Allergen Reactions   Codeine Nausea And Vomiting    Family History: Family History  Problem Relation Age of Onset   Colon cancer Brother    Cancer Other    Prostate cancer Brother    Lung cancer Father    Kidney disease Neg Hx    Bladder Cancer Neg Hx     Social History:  reports that he has quit smoking. He has never used smokeless tobacco. He reports that he does not drink alcohol and does not use drugs.  ROS: For pertinent review of systems please refer to history of present illness  Physical Exam: BP (!) 150/72    Pulse 80    Ht 5\' 4"  (1.626 m)    Wt 157 lb (71.2 kg)    BMI 26.95 kg/m   Constitutional:  Well nourished. Alert and oriented, No acute distress. HEENT: [place] AT, mask in place.  Trachea midline Cardiovascular: No clubbing, cyanosis, or edema. Respiratory: Normal respiratory effort, no increased work of breathing. Neurologic: Grossly intact, no focal deficits, moving all 4 extremities. Psychiatric: Normal mood and affect.   Laboratory Data: Urinalysis  Results for orders placed or performed in visit on 01/02/22  Microscopic Examination   Urine  Result Value Ref Range   WBC, UA 11-30 (A) 0 - 5 /hpf   RBC 0-2 0 - 2 /hpf   Epithelial Cells (non renal) 0-10 0 - 10 /hpf   Crystals Present (A) N/A   Crystal Type Amorphous Sediment N/A   Bacteria, UA Many (A) None seen/Few  Urinalysis, Complete  Result Value Ref Range   Specific Gravity, UA 1.020 1.005 - 1.030   pH, UA 7.0 5.0 - 7.5   Color, UA Yellow Yellow   Appearance Ur Cloudy (A) Clear   Leukocytes,UA 1+ (A) Negative   Protein,UA 1+ (A) Negative/Trace   Glucose, UA Negative  Negative   Ketones, UA Trace (A) Negative   RBC, UA Negative Negative   Bilirubin, UA Negative Negative   Urobilinogen, Ur 0.2 0.2 - 1.0 mg/dL   Nitrite, UA Positive (A) Negative   Microscopic Examination See below:  I have reviewed the labs.    Pertinent Imaging No recent imaging   Assessment & Plan:    1. Suspected UTI -Symptomatic at this visit -UA grossly infected -Urine sent for culture -Started on Septra DS twice daily for 7 days emphasized to the patient that he needs to complete the prescription  2. Retrograde ejaculation -Explained to the patient that this is a common side effect of the tamsulosin and offered changing to 5 mg tadalafil daily, but he deferred at this time  3. BPH with LUTS -symptoms stable  -Continue tamsulosin 0.4 mg daily and finasteride 5 mg daily  4. High risk hematuria -Hematuria work up completed in 07/2018 - findings positive for moderate trabeculation; large posterior wall bladder diverticulum. -No report of gross hematuria  -UA today negative for micro heme  No follow-ups on file.  These notes generated with voice recognition software. I apologize for typographical errors.  Zara Council, PA-C  [facility] [address] [place], [place] [address] [phone]

## 2022-01-02 ENCOUNTER — Ambulatory Visit (INDEPENDENT_AMBULATORY_CARE_PROVIDER_SITE_OTHER): Payer: Commercial Managed Care - HMO | Admitting: Urology

## 2022-01-02 ENCOUNTER — Other Ambulatory Visit: Payer: Self-pay

## 2022-01-02 ENCOUNTER — Encounter: Payer: Self-pay | Admitting: Urology

## 2022-01-02 VITALS — BP 150/72 | HR 80 | Ht 64.0 in | Wt 157.0 lb

## 2022-01-02 DIAGNOSIS — R3989 Other symptoms and signs involving the genitourinary system: Secondary | ICD-10-CM

## 2022-01-02 DIAGNOSIS — N5314 Retrograde ejaculation: Secondary | ICD-10-CM | POA: Diagnosis not present

## 2022-01-02 DIAGNOSIS — R319 Hematuria, unspecified: Secondary | ICD-10-CM | POA: Diagnosis not present

## 2022-01-02 DIAGNOSIS — N401 Enlarged prostate with lower urinary tract symptoms: Secondary | ICD-10-CM

## 2022-01-02 LAB — MICROSCOPIC EXAMINATION

## 2022-01-02 LAB — URINALYSIS, COMPLETE
Bilirubin, UA: NEGATIVE
Glucose, UA: NEGATIVE
Nitrite, UA: POSITIVE — AB
RBC, UA: NEGATIVE
Specific Gravity, UA: 1.02 (ref 1.005–1.030)
Urobilinogen, Ur: 0.2 mg/dL (ref 0.2–1.0)
pH, UA: 7 (ref 5.0–7.5)

## 2022-01-02 MED ORDER — SULFAMETHOXAZOLE-TRIMETHOPRIM 800-160 MG PO TABS: 1.0000 | ORAL_TABLET | Freq: Two times a day (BID) | ORAL | 0 refills | Status: DC

## 2022-01-08 LAB — CULTURE, URINE COMPREHENSIVE

## 2022-01-09 ENCOUNTER — Telehealth: Payer: Self-pay | Admitting: *Deleted

## 2022-01-09 MED ORDER — AMOXICILLIN-POT CLAVULANATE 875-125 MG PO TABS: 1.0000 | ORAL_TABLET | Freq: Two times a day (BID) | ORAL | 0 refills | Status: AC

## 2022-01-09 NOTE — Telephone Encounter (Signed)
-----   Message from Harle Battiest, PA-C sent at 01/08/2022  2:43 PM EST ----- Please let Mr. Mullenbach know that his urine culture was positive for infection and we need to change from the Septra to Augmentin 875/125, twice daily for seven days

## 2022-02-02 ENCOUNTER — Encounter: Payer: Self-pay | Admitting: Podiatry

## 2022-02-02 ENCOUNTER — Ambulatory Visit (INDEPENDENT_AMBULATORY_CARE_PROVIDER_SITE_OTHER): Payer: Medicare Other | Admitting: Podiatry

## 2022-02-02 ENCOUNTER — Other Ambulatory Visit: Payer: Self-pay

## 2022-02-02 DIAGNOSIS — M79674 Pain in right toe(s): Secondary | ICD-10-CM | POA: Diagnosis not present

## 2022-02-02 DIAGNOSIS — B351 Tinea unguium: Secondary | ICD-10-CM | POA: Diagnosis not present

## 2022-02-02 DIAGNOSIS — M79675 Pain in left toe(s): Secondary | ICD-10-CM

## 2022-02-02 DIAGNOSIS — E119 Type 2 diabetes mellitus without complications: Secondary | ICD-10-CM

## 2022-02-02 NOTE — Progress Notes (Signed)
This patient returns to my office for at risk foot care.  This patient requires this care by a professional since this patient will be at risk due to having diabetes.  This patient is unable to cut nails himself since the patient cannot reach his nails.These nails are painful walking and wearing shoes. Patient has not been seen in over 9 months. This patient presents for at risk foot care today.    General Appearance  Alert, conversant and in no acute stress.  Vascular  Dorsalis pedis and posterior tibial  pulses are palpable  bilaterally.  Capillary return is within normal limits  bilaterally. Temperature is within normal limits  bilaterally.  Neurologic  Senn-Weinstein monofilament wire test within normal limits  bilaterally. Muscle power within normal limits bilaterally.  Nails Thick disfigured discolored nails with subungual debris  from hallux to fifth toes bilaterally. No evidence of bacterial infection or drainage bilaterally.  Orthopedic  No limitations of motion  feet .  No crepitus or effusions noted.  No bony pathology or digital deformities noted.  Skin  normotropic skin with no porokeratosis noted bilaterally.  No signs of infections or ulcers noted.     Onychomycosis  Pain in right toes  Pain in left toes  Consent was obtained for treatment procedures.   Mechanical debridement of nails 1-5  bilaterally performed with a nail nipper.  Filed with dremel without incident.    Return office visit   3 months                  Told patient to return for periodic foot care and evaluation due to potential at risk complications.   Theodor Mustin DPM  

## 2022-05-11 ENCOUNTER — Ambulatory Visit (INDEPENDENT_AMBULATORY_CARE_PROVIDER_SITE_OTHER): Payer: Medicare Other | Admitting: Podiatry

## 2022-05-11 ENCOUNTER — Encounter: Payer: Self-pay | Admitting: Podiatry

## 2022-05-11 DIAGNOSIS — M79674 Pain in right toe(s): Secondary | ICD-10-CM

## 2022-05-11 DIAGNOSIS — M79675 Pain in left toe(s): Secondary | ICD-10-CM | POA: Diagnosis not present

## 2022-05-11 DIAGNOSIS — B351 Tinea unguium: Secondary | ICD-10-CM

## 2022-05-11 DIAGNOSIS — E119 Type 2 diabetes mellitus without complications: Secondary | ICD-10-CM

## 2022-05-11 NOTE — Progress Notes (Signed)
This patient returns to my office for at risk foot care.  This patient requires this care by a professional since this patient will be at risk due to having diabetes.  This patient is unable to cut nails himself since the patient cannot reach his nails.These nails are painful walking and wearing shoes. Patient has not been seen in over 3  months. This patient presents for at risk foot care today.    General Appearance  Alert, conversant and in no acute stress.  Vascular  Dorsalis pedis and posterior tibial  pulses are palpable  bilaterally.  Capillary return is within normal limits  bilaterally. Temperature is within normal limits  bilaterally.  Neurologic  Senn-Weinstein monofilament wire test within normal limits  bilaterally. Muscle power within normal limits bilaterally.  Nails Thick disfigured discolored nails with subungual debris  from hallux to fifth toes bilaterally. No evidence of bacterial infection or drainage bilaterally.  Orthopedic  No limitations of motion  feet .  No crepitus or effusions noted.  No bony pathology or digital deformities noted.  Skin  normotropic skin with no porokeratosis noted bilaterally.  No signs of infections or ulcers noted.     Onychomycosis  Pain in right toes  Pain in left toes  Consent was obtained for treatment procedures.   Mechanical debridement of nails 1-5  bilaterally performed with a nail nipper.  Filed with dremel without incident.    Return office visit   3 months                  Told patient to return for periodic foot care and evaluation due to potential at risk complications.   Darrall Strey DPM  

## 2022-08-20 ENCOUNTER — Ambulatory Visit (INDEPENDENT_AMBULATORY_CARE_PROVIDER_SITE_OTHER): Payer: Medicare Other | Admitting: Podiatry

## 2022-08-20 ENCOUNTER — Encounter: Payer: Self-pay | Admitting: Podiatry

## 2022-08-20 DIAGNOSIS — E119 Type 2 diabetes mellitus without complications: Secondary | ICD-10-CM

## 2022-08-20 DIAGNOSIS — M79675 Pain in left toe(s): Secondary | ICD-10-CM | POA: Diagnosis not present

## 2022-08-20 DIAGNOSIS — B351 Tinea unguium: Secondary | ICD-10-CM

## 2022-08-20 DIAGNOSIS — M79674 Pain in right toe(s): Secondary | ICD-10-CM

## 2022-08-20 NOTE — Progress Notes (Signed)
This patient returns to my office for at risk foot care.  This patient requires this care by a professional since this patient will be at risk due to having diabetes.  This patient is unable to cut nails himself since the patient cannot reach his nails.These nails are painful walking and wearing shoes. Patient has not been seen in over 3  months. This patient presents for at risk foot care today.    General Appearance  Alert, conversant and in no acute stress.  Vascular  Dorsalis pedis and posterior tibial  pulses are palpable  bilaterally.  Capillary return is within normal limits  bilaterally. Temperature is within normal limits  bilaterally.  Neurologic  Senn-Weinstein monofilament wire test within normal limits  bilaterally. Muscle power within normal limits bilaterally.  Nails Thick disfigured discolored nails with subungual debris  from hallux to fifth toes bilaterally. No evidence of bacterial infection or drainage bilaterally.  Orthopedic  No limitations of motion  feet .  No crepitus or effusions noted.  No bony pathology or digital deformities noted.  Skin  normotropic skin with no porokeratosis noted bilaterally.  No signs of infections or ulcers noted.     Onychomycosis  Pain in right toes  Pain in left toes  Consent was obtained for treatment procedures.   Mechanical debridement of nails 1-5  bilaterally performed with a nail nipper.  Filed with dremel without incident.    Return office visit   3 months                  Told patient to return for periodic foot care and evaluation due to potential at risk complications.   Helane Gunther DPM

## 2022-09-15 ENCOUNTER — Other Ambulatory Visit: Payer: Self-pay | Admitting: *Deleted

## 2022-09-15 ENCOUNTER — Encounter: Payer: Self-pay | Admitting: Family Medicine

## 2022-09-15 ENCOUNTER — Telehealth: Payer: Self-pay | Admitting: *Deleted

## 2022-09-15 DIAGNOSIS — Z1211 Encounter for screening for malignant neoplasm of colon: Secondary | ICD-10-CM

## 2022-09-15 DIAGNOSIS — Z86010 Personal history of colonic polyps: Secondary | ICD-10-CM

## 2022-09-15 MED ORDER — NA SULFATE-K SULFATE-MG SULF 17.5-3.13-1.6 GM/177ML PO SOLN: 1.0000 | Freq: Once | ORAL | 0 refills | Status: AC

## 2022-09-15 NOTE — Telephone Encounter (Signed)
Gastroenterology Pre-Procedure Review  Request Date: 10/15/2022 Requesting Physician: Dr. Allen Norris  PATIENT REVIEW QUESTIONS: The patient responded to the following health history questions as indicated:    1. Are you having any GI issues? no 2. Do you have a personal history of Polyps? yes (polyps were found in last colonoscopy in 2012) 3. Do you have a family history of Colon Cancer or Polyps? yes (brother had colon cancer) 4. Diabetes Mellitus? no 5. Joint replacements in the past 12 months?no 6. Major health problems in the past 3 months?no 7. Any artificial heart valves, MVP, or defibrillator?no    MEDICATIONS & ALLERGIES:    Patient reports the following regarding taking any anticoagulation/antiplatelet therapy:   Plavix, Coumadin, Eliquis, Xarelto, Lovenox, Pradaxa, Brilinta, or Effient? no Aspirin? yes (81 mg)  Patient confirms/reports the following medications:  Current Outpatient Medications  Medication Sig Dispense Refill   aspirin 81 MG tablet Take 81 mg by mouth daily.     finasteride (PROSCAR) 5 MG tablet Take 1 tablet (5 mg total) by mouth daily. 90 tablet 3   hydrochlorothiazide (HYDRODIURIL) 25 MG tablet Take 25 mg by mouth daily.      lisinopril (PRINIVIL,ZESTRIL) 10 MG tablet Take 10 mg by mouth daily.      simvastatin (ZOCOR) 20 MG tablet Take 20 mg by mouth daily.      tadalafil (CIALIS) 20 MG tablet Take 20 mg by mouth daily.     tamsulosin (FLOMAX) 0.4 MG CAPS capsule Take 1 capsule (0.4 mg total) by mouth daily. 90 capsule 3   No current facility-administered medications for this visit.    Patient confirms/reports the following allergies:  Allergies  Allergen Reactions   Codeine Nausea And Vomiting    No orders of the defined types were placed in this encounter.   AUTHORIZATION INFORMATION Primary Insurance: 1D#: Group #:  Secondary Insurance: 1D#: Group #:  SCHEDULE INFORMATION: Date: 10/15/2022 Time: Location: [facility]

## 2022-09-24 ENCOUNTER — Other Ambulatory Visit: Payer: Self-pay | Admitting: *Deleted

## 2022-09-24 DIAGNOSIS — N401 Enlarged prostate with lower urinary tract symptoms: Secondary | ICD-10-CM

## 2022-09-25 ENCOUNTER — Other Ambulatory Visit: Payer: Medicare Other

## 2022-09-28 NOTE — Progress Notes (Deleted)
8:33 AM   Georgian Co [name] [birth year] [id number]  Referring provider: Letta Median, MD [place],  [place] [address]  Urological history: 1. 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 High risk hematuria -former smoker -CTU and cysto 2015 - bilateral punctate stones, enlarged prostate, a bladder diverticulum with hypospadias -CTU and cysto 2019 - bilateral punctate stones, moderate trabeculation; large posterior wall bladder diverticulum. Urine cytology was negative -no reports of gross hematuria -UA (12/2021) negative for micro heme   4. Nephrolithiasis -bilateral punctate stones CTU 2019  5.  Epididymitis -Last occurrence 2020  6. ED -contributing factors of age, HTN, diabetes, BPH and smoking history -failed PDE5i's   No chief complaint on file.   HPI: Vincent Bautista is a 76 y.o. male who presents today for yearly visit.         Score:  1-7 Mild 8-19 Moderate 20-35 Severe    PMH: Past Medical History:  Diagnosis Date   Bilateral kidney stones    BPH (benign prostatic hypertrophy)    Diabetes mellitus, type 2 (HCC)    Hematuria    gross   HTN (hypertension)    Urinary retention     Surgical History: Past Surgical History:  Procedure Laterality Date   none      Home Medications:  Allergies as of 09/29/2022       Reactions   Codeine Nausea And Vomiting        Medication List        Accurate as of September 28, 2022  8:33 AM. If you have any questions, ask your nurse or doctor.          aspirin 81 MG tablet Take 81 mg by mouth daily.   finasteride 5 MG tablet Commonly known as: PROSCAR Take 1 tablet (5 mg total) by mouth daily.   hydrochlorothiazide 25 MG tablet Commonly known as: HYDRODIURIL Take 25 mg by mouth daily.   lisinopril 10 MG tablet Commonly  known as: ZESTRIL Take 10 mg by mouth daily.   simvastatin 20 MG tablet Commonly known as: ZOCOR Take 20 mg by mouth daily.   tadalafil 20 MG tablet Commonly known as: CIALIS Take 20 mg by mouth daily.   tamsulosin 0.4 MG Caps capsule Commonly known as: FLOMAX Take 1 capsule (0.4 mg total) by mouth daily.        Allergies:  Allergies  Allergen Reactions   Codeine Nausea And Vomiting    Family History: Family History  Problem Relation Age of Onset   Colon cancer Brother    Cancer Other    Prostate cancer Brother    Lung cancer Father    Kidney disease Neg Hx    Bladder Cancer Neg Hx     Social History:  reports that he has quit smoking. He has never used smokeless tobacco. He reports that he does not drink alcohol and does not use drugs.  ROS: For pertinent review of systems please refer to history of present illness  Physical Exam: There were no vitals taken for this visit.  Constitutional:  Well nourished. Alert and oriented, No acute distress. HEENT: [place] AT, moist mucus membranes.  Trachea midline Cardiovascular: No clubbing, cyanosis, or edema. Respiratory: Normal respiratory effort, no increased work of breathing. GU: No CVA tenderness.  No bladder fullness or  masses.  Patient with circumcised/uncircumcised phallus. ***Foreskin easily retracted***  Urethral meatus is patent.  No penile discharge. No penile lesions or rashes. Scrotum without lesions, cysts, rashes and/or edema.  Testicles are located scrotally bilaterally. No masses are appreciated in the testicles. Left and right epididymis are normal. Rectal: Patient with  normal sphincter tone. Anus and perineum without scarring or rashes. No rectal masses are appreciated. Prostate is approximately *** grams, *** nodules are appreciated. Seminal vesicles are normal. Neurologic: Grossly intact, no focal deficits, moving all 4 extremities. Psychiatric: Normal mood and affect.   Laboratory Data: N/A  Pertinent  Imaging ***   Assessment & Plan:    1. BPH with LUTS -PSA pending -DRE benign *** -PVR < 300 cc *** -symptoms - *** -most bothersome symptoms are *** -continue conservative management, avoiding bladder irritants and timed voiding's -Continue tamsulosin 0.4 mg daily and finasteride 5 mg daily -Cannot tolerate medication or medication failure, schedule cystoscopy ***   2. High risk hematuria -former smoker -Hematuria work up completed in 07/2018 - findings positive for moderate trabeculation; large posterior wall bladder diverticulum. -No report of gross hematuria  -UA (12/2021) no micro heme  3. Prostate cancer screening -discussed AUA guidelines (2023) concerning prostate cancer screening in individuals 82 years of age or older as it is felt that the risks of treating a prostate cancer out weighs the benefit of finding prostate cancer as men at his age are likely to die of other causes vs prostate cancer -PSA pending today, but we will discontinue screening moving forward   No follow-ups on file.  These notes generated with voice recognition software. I apologize for typographical errors.  Cloretta Ned  [facility] [address] [place], [place] [address] [phone]

## 2022-09-29 ENCOUNTER — Ambulatory Visit: Payer: Medicare Other | Admitting: Urology

## 2022-09-29 DIAGNOSIS — Z125 Encounter for screening for malignant neoplasm of prostate: Secondary | ICD-10-CM

## 2022-09-29 DIAGNOSIS — R319 Hematuria, unspecified: Secondary | ICD-10-CM

## 2022-09-29 DIAGNOSIS — N401 Enlarged prostate with lower urinary tract symptoms: Secondary | ICD-10-CM

## 2022-10-05 ENCOUNTER — Other Ambulatory Visit: Payer: Medicare Other

## 2022-10-05 DIAGNOSIS — N401 Enlarged prostate with lower urinary tract symptoms: Secondary | ICD-10-CM

## 2022-10-06 LAB — PSA: Prostate Specific Ag, Serum: 0.3 ng/mL (ref 0.0–4.0)

## 2022-10-06 NOTE — Progress Notes (Unsigned)
1:30 PM   Vincent Bautista [facility] August 17, 1946 [id number]  Referring provider: Letta Median, MD [place],  [place] [id number]  Urological history: 1. 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 High risk hematuria -former smoker -CTU and cysto 2015 - bilateral punctate stones, enlarged prostate, a bladder diverticulum with hypospadias -CTU and cysto 2019 - bilateral punctate stones, moderate trabeculation; large posterior wall bladder diverticulum. Urine cytology was negative -no reports of gross hematuria -UA (12/2021) negative for micro heme   4. Nephrolithiasis -bilateral punctate stones CTU 2019  5.  Epididymitis -Last occurrence 2020  6. ED -contributing factors of age, HTN, diabetes, BPH and smoking history -failed PDE5i's   No chief complaint on file.   HPI: Vincent Bautista is a 76 y.o. male who presents today for yearly visit.         Score:  1-7 Mild 8-19 Moderate 20-35 Severe    PMH: Past Medical History:  Diagnosis Date   Bilateral kidney stones    BPH (benign prostatic hypertrophy)    Diabetes mellitus, type 2 (HCC)    Hematuria    gross   HTN (hypertension)    Urinary retention     Surgical History: Past Surgical History:  Procedure Laterality Date   none      Home Medications:  Allergies as of 10/07/2022       Reactions   Codeine Nausea And Vomiting        Medication List        Accurate as of October 06, 2022  1:30 PM. If you have any questions, ask your nurse or doctor.          aspirin 81 MG tablet Take 81 mg by mouth daily.   finasteride 5 MG tablet Commonly known as: PROSCAR Take 1 tablet (5 mg total) by mouth daily.   hydrochlorothiazide 25 MG tablet Commonly known as: HYDRODIURIL Take 25 mg by mouth daily.   lisinopril 10 MG  tablet Commonly known as: ZESTRIL Take 10 mg by mouth daily.   simvastatin 20 MG tablet Commonly known as: ZOCOR Take 20 mg by mouth daily.   tadalafil 20 MG tablet Commonly known as: CIALIS Take 20 mg by mouth daily.   tamsulosin 0.4 MG Caps capsule Commonly known as: FLOMAX Take 1 capsule (0.4 mg total) by mouth daily.        Allergies:  Allergies  Allergen Reactions   Codeine Nausea And Vomiting    Family History: Family History  Problem Relation Age of Onset   Colon cancer Brother    Cancer Other    Prostate cancer Brother    Lung cancer Father    Kidney disease Neg Hx    Bladder Cancer Neg Hx     Social History:  reports that he has quit smoking. He has never used smokeless tobacco. He reports that he does not drink alcohol and does not use drugs.  ROS: For pertinent review of systems please refer to history of present illness  Physical Exam: There were no vitals taken for this visit.  Constitutional:  Well nourished. Alert and oriented, No acute distress. HEENT: [place] AT, moist mucus membranes.  Trachea midline Cardiovascular: No clubbing, cyanosis, or edema. Respiratory: Normal respiratory effort, no increased work of breathing. GU: No CVA tenderness.  No bladder fullness  or masses.  Patient with circumcised/uncircumcised phallus. ***Foreskin easily retracted***  Urethral meatus is patent.  No penile discharge. No penile lesions or rashes. Scrotum without lesions, cysts, rashes and/or edema.  Testicles are located scrotally bilaterally. No masses are appreciated in the testicles. Left and right epididymis are normal. Rectal: Patient with  normal sphincter tone. Anus and perineum without scarring or rashes. No rectal masses are appreciated. Prostate is approximately *** grams, *** nodules are appreciated. Seminal vesicles are normal. Neurologic: Grossly intact, no focal deficits, moving all 4 extremities. Psychiatric: Normal mood and affect.   Laboratory  Data: N/A  Pertinent Imaging ***   Assessment & Plan:    1. BPH with LUTS -PSA pending -DRE benign *** -PVR < 300 cc *** -symptoms - *** -most bothersome symptoms are *** -continue conservative management, avoiding bladder irritants and timed voiding's -Continue tamsulosin 0.4 mg daily and finasteride 5 mg daily -Cannot tolerate medication or medication failure, schedule cystoscopy ***   2. High risk hematuria -former smoker -Hematuria work up completed in 07/2018 - findings positive for moderate trabeculation; large posterior wall bladder diverticulum. -No report of gross hematuria  -UA (12/2021) no micro heme  3. Prostate cancer screening -discussed AUA guidelines (2023) concerning prostate cancer screening in individuals 72 years of age or older as it is felt that the risks of treating a prostate cancer out weighs the benefit of finding prostate cancer as men at his age are likely to die of other causes vs prostate cancer -PSA pending today, but we will discontinue screening moving forward   No follow-ups on file.  These notes generated with voice recognition software. I apologize for typographical errors.  Cloretta Ned  [facility] [address] [place], [place] [address] [phone]

## 2022-10-07 ENCOUNTER — Encounter: Payer: Self-pay | Admitting: Urology

## 2022-10-07 ENCOUNTER — Ambulatory Visit (INDEPENDENT_AMBULATORY_CARE_PROVIDER_SITE_OTHER): Payer: Medicare Other | Admitting: Urology

## 2022-10-07 VITALS — BP 124/69 | HR 77 | Ht 65.0 in | Wt 154.0 lb

## 2022-10-07 DIAGNOSIS — Z125 Encounter for screening for malignant neoplasm of prostate: Secondary | ICD-10-CM | POA: Diagnosis not present

## 2022-10-07 DIAGNOSIS — R319 Hematuria, unspecified: Secondary | ICD-10-CM

## 2022-10-07 DIAGNOSIS — N401 Enlarged prostate with lower urinary tract symptoms: Secondary | ICD-10-CM

## 2022-10-07 LAB — BLADDER SCAN AMB NON-IMAGING

## 2022-10-07 MED ORDER — TAMSULOSIN HCL 0.4 MG PO CAPS: 0.4000 mg | ORAL_CAPSULE | Freq: Every day | ORAL | 3 refills | Status: DC

## 2022-10-07 MED ORDER — FINASTERIDE 5 MG PO TABS: 5.0000 mg | ORAL_TABLET | Freq: Every day | ORAL | 3 refills | Status: DC

## 2022-10-08 ENCOUNTER — Other Ambulatory Visit: Payer: Self-pay | Admitting: Urology

## 2022-10-08 DIAGNOSIS — R319 Hematuria, unspecified: Secondary | ICD-10-CM

## 2022-10-15 ENCOUNTER — Ambulatory Visit
Admission: RE | Admit: 2022-10-15 | Discharge: 2022-10-15 | Disposition: A | Discharge status: Home / Self Care | Payer: Medicare Other | Source: Ambulatory Visit | Attending: Gastroenterology | Admitting: Gastroenterology

## 2022-10-15 ENCOUNTER — Ambulatory Visit: Payer: Medicare Other | Admitting: Anesthesiology

## 2022-10-15 ENCOUNTER — Other Ambulatory Visit: Payer: Self-pay

## 2022-10-15 ENCOUNTER — Encounter
Admission: RE | Disposition: A | Discharge status: Home / Self Care | Payer: Self-pay | Source: Ambulatory Visit | Attending: Gastroenterology

## 2022-10-15 ENCOUNTER — Encounter: Payer: Self-pay | Admitting: Gastroenterology

## 2022-10-15 DIAGNOSIS — Z87891 Personal history of nicotine dependence: Secondary | ICD-10-CM | POA: Insufficient documentation

## 2022-10-15 DIAGNOSIS — Z860100 Personal history of colon polyps, unspecified: Secondary | ICD-10-CM

## 2022-10-15 DIAGNOSIS — I10 Essential (primary) hypertension: Secondary | ICD-10-CM | POA: Diagnosis not present

## 2022-10-15 DIAGNOSIS — N401 Enlarged prostate with lower urinary tract symptoms: Secondary | ICD-10-CM | POA: Diagnosis not present

## 2022-10-15 DIAGNOSIS — R338 Other retention of urine: Secondary | ICD-10-CM | POA: Diagnosis not present

## 2022-10-15 DIAGNOSIS — K640 First degree hemorrhoids: Secondary | ICD-10-CM | POA: Insufficient documentation

## 2022-10-15 DIAGNOSIS — Z79899 Other long term (current) drug therapy: Secondary | ICD-10-CM | POA: Insufficient documentation

## 2022-10-15 DIAGNOSIS — Z1211 Encounter for screening for malignant neoplasm of colon: Secondary | ICD-10-CM | POA: Diagnosis present

## 2022-10-15 HISTORY — PX: COLONOSCOPY WITH PROPOFOL: SHX5780

## 2022-10-15 SURGERY — COLONOSCOPY WITH PROPOFOL
Anesthesia: General

## 2022-10-15 MED ORDER — LIDOCAINE HCL (CARDIAC) PF 100 MG/5ML IV SOSY
PREFILLED_SYRINGE | INTRAVENOUS | Status: DC | PRN
  Administered 2022-10-15: 100 mg via INTRAVENOUS

## 2022-10-15 MED ORDER — PHENYLEPHRINE HCL (PRESSORS) 10 MG/ML IV SOLN
INTRAVENOUS | Status: DC | PRN
  Administered 2022-10-15: 80 ug via INTRAVENOUS

## 2022-10-15 MED ORDER — SODIUM CHLORIDE 0.9 % IV SOLN: INTRAVENOUS | Status: DC

## 2022-10-15 MED ORDER — PROPOFOL 10 MG/ML IV BOLUS
INTRAVENOUS | Status: DC | PRN
  Administered 2022-10-15: 90 mg via INTRAVENOUS
  Administered 2022-10-15: 150 ug/kg/min via INTRAVENOUS

## 2022-10-15 NOTE — Anesthesia Preprocedure Evaluation (Signed)
Anesthesia Evaluation  Patient identified by MRN, date of birth, ID band Patient awake    Reviewed: Allergy & Precautions, H&P , NPO status , Patient's Chart, lab work & pertinent test results, reviewed documented beta blocker date and time   Airway Mallampati: II   Neck ROM: full    Dental  (+) Poor Dentition   Pulmonary neg pulmonary ROS, former smoker,    Pulmonary exam normal        Cardiovascular Exercise Tolerance: Good hypertension, On Medications negative cardio ROS Normal cardiovascular exam Rhythm:regular Rate:Normal     Neuro/Psych negative neurological ROS  negative psych ROS   GI/Hepatic negative GI ROS, Neg liver ROS,   Endo/Other  negative endocrine ROSdiabetes, Well Controlled  Renal/GU negative Renal ROS  negative genitourinary   Musculoskeletal   Abdominal   Peds  Hematology negative hematology ROS (+)   Anesthesia Other Findings Past Medical History: No date: Bilateral kidney stones No date: BPH (benign prostatic hypertrophy) No date: Diabetes mellitus, type 2 (HCC) No date: Hematuria     Comment:  gross No date: HTN (hypertension) No date: Urinary retention Past Surgical History: No date: COLONOSCOPY No date: none BMI    Body Mass Index: 25.75 kg/m     Reproductive/Obstetrics negative OB ROS                             Anesthesia Physical Anesthesia Plan  ASA: 2  Anesthesia Plan: General   Post-op Pain Management:    Induction:   PONV Risk Score and Plan:   Airway Management Planned:   Additional Equipment:   Intra-op Plan:   Post-operative Plan:   Informed Consent: I have reviewed the patients History and Physical, chart, labs and discussed the procedure including the risks, benefits and alternatives for the proposed anesthesia with the patient or authorized representative who has indicated his/her understanding and acceptance.     Dental  Advisory Given  Plan Discussed with: CRNA  Anesthesia Plan Comments:         Anesthesia Quick Evaluation

## 2022-10-15 NOTE — Op Note (Signed)
[name] Procedure Date: 10/15/2022 10:53 AM MRN: [id number] Account #: [id number] Date of Birth: [birth year] Admit Type: Outpatient Age: 76 Room: [facility] ENDO ROOM 4 Gender: Male Note Status: Finalized Instrument Name: Jasper Riling [id number] Procedure:             Colonoscopy Indications:           High risk colon cancer surveillance: Personal history                         of colonic polyps Providers:             Lucilla Lame MD, MD Referring MD:          Baxter Kail. Rebeca Alert MD, MD (Referring MD) Medicines:             Propofol per Anesthesia Complications:         No immediate complications. Procedure:             Pre-Anesthesia Assessment:                        - Prior to the procedure, a History and Physical was                         performed, and patient medications and allergies were                         reviewed. The patient's tolerance of previous                         anesthesia was also reviewed. The risks and benefits                         of the procedure and the sedation options and risks                         were discussed with the patient. All questions were                         answered, and informed consent was obtained. Prior                         Anticoagulants: The patient has taken no anticoagulant                         or antiplatelet agents. ASA Grade Assessment: II - A                         patient with mild systemic disease. After reviewing                         the risks and benefits, the patient was deemed in                         satisfactory condition to undergo the procedure.                        After obtaining informed consent, the colonoscope was  passed under direct vision. Throughout the procedure,                         the patient's blood pressure, pulse, and oxygen                         saturations were monitored continuously. The                          Colonoscope was introduced through the anus and                         advanced to the the cecum, identified by appendiceal                         orifice and ileocecal valve. The colonoscopy was                         performed without difficulty. The patient tolerated                         the procedure well. The quality of the bowel                         preparation was adequate to identify polyps. Findings:      The perianal and digital rectal examinations were normal.      Non-bleeding internal hemorrhoids were found during retroflexion. The       hemorrhoids were Grade I (internal hemorrhoids that do not prolapse).      The exam was otherwise without abnormality. Impression:            - Non-bleeding internal hemorrhoids.                        - The examination was otherwise normal.                        - No specimens collected. Recommendation:        - Discharge patient to home.                        - Resume previous diet.                        - Continue present medications.                        - Repeat colonoscopy for surveillance after piecemeal                         polypectomy.                        - Repeat colonoscopy is not recommended for screening                         purposes. Procedure Code(s):     --- Professional ---                        [phone], Colonoscopy, flexible; diagnostic, including  collection of specimen(s) by brushing or washing, when                         performed (separate procedure) Diagnosis Code(s):     --- Professional ---                        Z86.010, Personal history of colonic polyps CPT copyright 2022 American Medical Association. All rights reserved. The codes documented in this report are preliminary and upon coder review may  be revised to meet current compliance requirements. Lucilla Lame MD, MD 10/15/2022 11:14:07 AM This report has been signed electronically. Number of  Addenda: 0 Note Initiated On: 10/15/2022 10:53 AM Scope Withdrawal Time: 0 hours 6 minutes 22 seconds  Total Procedure Duration: 0 hours 8 minutes 34 seconds  Estimated Blood Loss:  Estimated blood loss: none.      [facility]

## 2022-10-15 NOTE — H&P (Signed)
Lucilla Lame, MD [address] [place], [place] [address] [phone] Fax : [phone]  Primary Care Physician:  Letta Median, MD Primary Gastroenterologist:  Dr. Allen Norris  Pre-Procedure History & Physical: HPI:  Vincent Bautista is a 76 y.o. male is here for an colonoscopy.   Past Medical History:  Diagnosis Date   Bilateral kidney stones    BPH (benign prostatic hypertrophy)    Diabetes mellitus, type 2 (HCC)    Hematuria    gross   HTN (hypertension)    Urinary retention     Past Surgical History:  Procedure Laterality Date   COLONOSCOPY     none      Prior to Admission medications   Medication Sig Start Date End Date Taking? Authorizing Provider  aspirin 81 MG tablet Take 81 mg by mouth daily.   Yes [provider]  finasteride (PROSCAR) 5 MG tablet Take 1 tablet (5 mg total) by mouth daily. 10/07/22  Yes McGowan, Larene [place] A, PA-C  hydrochlorothiazide (HYDRODIURIL) 25 MG tablet Take 25 mg by mouth daily.  04/05/16  Yes [provider]  lisinopril (PRINIVIL,ZESTRIL) 10 MG tablet Take 10 mg by mouth daily.  04/16/16  Yes [provider]  simvastatin (ZOCOR) 20 MG tablet Take 20 mg by mouth daily.  04/05/16  Yes [provider]  tamsulosin (FLOMAX) 0.4 MG CAPS capsule Take 1 capsule (0.4 mg total) by mouth daily. 10/07/22  Yes McGowan, Larene [place] A, PA-C  tadalafil (CIALIS) 20 MG tablet Take 20 mg by mouth daily. 11/27/21   [provider]    Allergies as of 09/16/2022 - Review Complete 08/20/2022  Allergen Reaction Noted   Codeine Nausea And Vomiting 05/29/2016    Family History  Problem Relation Age of Onset   Colon cancer Brother    Cancer Other    Prostate cancer Brother    Lung cancer Father    Kidney disease Neg Hx    Bladder Cancer Neg Hx     Social History   Socioeconomic History   Marital status: Widowed    Spouse name: Not on file   Number of children: Not on file   Years of  education: Not on file   Highest education level: Not on file  Occupational History   Not on file  Tobacco Use   Smoking status: Former   Smokeless tobacco: Never   Tobacco comments:    quit 30 years  Vaping Use   Vaping Use: Never used  Substance and Sexual Activity   Alcohol use: No    Alcohol/week: 0.0 standard drinks of alcohol   Drug use: No   Sexual activity: Yes    Birth control/protection: None  Other Topics Concern   Not on file  Social History Narrative   Not on file   Social Determinants of Health   Financial Resource Strain: Not on file  Food Insecurity: Not on file  Transportation Needs: Not on file  Physical Activity: Not on file  Stress: Not on file  Social Connections: Not on file  Intimate Partner Violence: Not on file    Review of Systems: See HPI, otherwise negative ROS  Physical Exam: BP 129/68   Pulse 74   Temp (!) 97.1 F (36.2 C) (Temporal)   Resp 18   Ht 5\' 4"  (1.626 m)   Wt 68 kg   SpO2 100%   BMI 25.75 kg/m  General:   Alert,  pleasant and cooperative in NAD Head:  Normocephalic  and atraumatic. Neck:  Supple; no masses or thyromegaly. Lungs:  Clear throughout to auscultation.    Heart:  Regular rate and rhythm. Abdomen:  Soft, nontender and nondistended. Normal bowel sounds, without guarding, and without rebound.   Neurologic:  Alert and  oriented x4;  grossly normal neurologically.  Impression/Plan: Vincent Bautista is here for an colonoscopy to be performed for a history of adenomatous polyps on 2012   Risks, benefits, limitations, and alternatives regarding  colonoscopy have been reviewed with the patient.  Questions have been answered.  All parties agreeable.   Lucilla Lame, MD  10/15/2022, 10:21 AM

## 2022-10-15 NOTE — Transfer of Care (Signed)
Immediate Anesthesia Transfer of Care Note  Patient: Vincent Bautista  Procedure(s) Performed: COLONOSCOPY WITH PROPOFOL  Patient Location: PACU  Anesthesia Type:General  Level of Consciousness: drowsy  Airway & Oxygen Therapy: Patient Spontanous Breathing  Post-op Assessment: Report given to RN and Post -op Vital signs reviewed and stable  Post vital signs: Reviewed and stable  Last Vitals:  Vitals Value Taken Time  BP 99/61 10/15/22 1116  Temp 36.1 C 10/15/22 1115  Pulse 87 10/15/22 1117  Resp 14 10/15/22 1117  SpO2 98 % 10/15/22 1117  Vitals shown include unvalidated device data.  Last Pain:  Vitals:   10/15/22 1115  TempSrc: Temporal  PainSc: 0-No pain         Complications: No notable events documented.

## 2022-10-16 ENCOUNTER — Encounter: Payer: Self-pay | Admitting: Gastroenterology

## 2022-10-16 NOTE — Anesthesia Postprocedure Evaluation (Signed)
Anesthesia Post Note  Patient: Vincent Bautista  Procedure(s) Performed: COLONOSCOPY WITH PROPOFOL  Patient location during evaluation: PACU Anesthesia Type: General Level of consciousness: awake and alert Pain management: pain level controlled Vital Signs Assessment: post-procedure vital signs reviewed and stable Respiratory status: spontaneous breathing, nonlabored ventilation, respiratory function stable and patient connected to nasal cannula oxygen Cardiovascular status: blood pressure returned to baseline and stable Postop Assessment: no apparent nausea or vomiting Anesthetic complications: no   No notable events documented.   Last Vitals:  Vitals:   10/15/22 1125 10/15/22 1135  BP: 120/72 (!) 141/74  Pulse: 94 84  Resp:    Temp:    SpO2: 100% 100%    Last Pain:  Vitals:   10/15/22 1135  TempSrc:   PainSc: 0-No pain                 Molli Barrows

## 2022-11-26 ENCOUNTER — Ambulatory Visit: Payer: Medicare Other | Admitting: Podiatry

## 2023-01-21 ENCOUNTER — Emergency Department
Admission: EM | Admit: 2023-01-21 | Discharge: 2023-01-21 | Disposition: A | Discharge status: Home / Self Care | Payer: 59 | Attending: Emergency Medicine | Admitting: Emergency Medicine

## 2023-01-21 ENCOUNTER — Other Ambulatory Visit: Payer: Self-pay

## 2023-01-21 ENCOUNTER — Emergency Department: Payer: 59

## 2023-01-21 ENCOUNTER — Other Ambulatory Visit: Payer: 59

## 2023-01-21 DIAGNOSIS — M25512 Pain in left shoulder: Secondary | ICD-10-CM | POA: Diagnosis not present

## 2023-01-21 DIAGNOSIS — R55 Syncope and collapse: Secondary | ICD-10-CM

## 2023-01-21 DIAGNOSIS — R519 Headache, unspecified: Secondary | ICD-10-CM | POA: Insufficient documentation

## 2023-01-21 DIAGNOSIS — E119 Type 2 diabetes mellitus without complications: Secondary | ICD-10-CM | POA: Insufficient documentation

## 2023-01-21 DIAGNOSIS — M542 Cervicalgia: Secondary | ICD-10-CM | POA: Insufficient documentation

## 2023-01-21 DIAGNOSIS — Y92410 Unspecified street and highway as the place of occurrence of the external cause: Secondary | ICD-10-CM | POA: Diagnosis not present

## 2023-01-21 DIAGNOSIS — I10 Essential (primary) hypertension: Secondary | ICD-10-CM | POA: Diagnosis not present

## 2023-01-21 LAB — BASIC METABOLIC PANEL
Anion gap: 11 (ref 5–15)
BUN: 21 mg/dL (ref 8–23)
CO2: 24 mmol/L (ref 22–32)
Calcium: 9.6 mg/dL (ref 8.9–10.3)
Chloride: 102 mmol/L (ref 98–111)
Creatinine, Ser: 1.34 mg/dL — ABNORMAL HIGH (ref 0.61–1.24)
GFR, Estimated: 56 mL/min — ABNORMAL LOW (ref >60)
Glucose, Bld: 123 mg/dL — ABNORMAL HIGH (ref 70–99)
Potassium: 3.5 mmol/L (ref 3.5–5.1)
Sodium: 137 mmol/L (ref 135–145)

## 2023-01-21 LAB — CBC
HCT: 38.6 % — ABNORMAL LOW (ref 39.0–52.0)
Hemoglobin: 12.4 g/dL — ABNORMAL LOW (ref 13.0–17.0)
MCH: 28.2 pg (ref 26.0–34.0)
MCHC: 32.1 g/dL (ref 30.0–36.0)
MCV: 87.9 fL (ref 80.0–100.0)
Platelets: 230 10*3/uL (ref 150–400)
RBC: 4.39 MIL/uL (ref 4.22–5.81)
RDW: 13.4 % (ref 11.5–15.5)
WBC: 9 10*3/uL (ref 4.0–10.5)
nRBC: 0 % (ref 0.0–0.2)

## 2023-01-21 LAB — TROPONIN I (HIGH SENSITIVITY): Troponin I (High Sensitivity): 12 ng/L (ref <18)
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
## 2023-02-16 ENCOUNTER — Telehealth: Payer: Self-pay | Admitting: Cardiology

## 2023-02-16 NOTE — Telephone Encounter (Signed)
Called to reschedule new patient appointment Called dropped

## 2023-02-22 NOTE — Telephone Encounter (Signed)
LMOV to reschedule

## 2023-03-17 ENCOUNTER — Ambulatory Visit: Payer: 59 | Admitting: Cardiology

## 2023-04-06 ENCOUNTER — Encounter: Payer: Self-pay | Admitting: *Deleted

## 2023-04-07 ENCOUNTER — Ambulatory Visit: Payer: 59 | Attending: Cardiology | Admitting: Cardiology

## 2023-04-07 ENCOUNTER — Ambulatory Visit (INDEPENDENT_AMBULATORY_CARE_PROVIDER_SITE_OTHER): Payer: 59

## 2023-04-07 ENCOUNTER — Encounter: Payer: Self-pay | Admitting: Cardiology

## 2023-04-07 VITALS — BP 124/70 | HR 68 | Ht 65.0 in | Wt 157.4 lb

## 2023-04-07 DIAGNOSIS — R55 Syncope and collapse: Secondary | ICD-10-CM

## 2023-04-07 DIAGNOSIS — I10 Essential (primary) hypertension: Secondary | ICD-10-CM | POA: Diagnosis not present

## 2023-04-07 NOTE — Patient Instructions (Signed)
Medication Instructions:   1., Your physician recommends that you continue on your current medications as directed. Please refer to the Current Medication list given to you today.  *If you need a refill on your cardiac medications before your next appointment, please call your pharmacy*   Lab Work:  None Ordered  If you have labs (blood work) drawn today and your tests are completely normal, you will receive your results only by: MyChart Message (if you have MyChart) OR A paper copy in the mail If you have any lab test that is abnormal or we need to change your treatment, we will call you to review the results.   Testing/Procedures:  Your physician has requested that you have an echocardiogram. Echocardiography is a painless test that uses sound waves to create images of your heart. It provides your doctor with information about the size and shape of your heart and how well your heart's chambers and valves are working. This procedure takes approximately one hour. There are no restrictions for this procedure. Please do NOT wear cologne, perfume, aftershave, or lotions (deodorant is allowed). Please arrive 15 minutes prior to your appointment time.  Your physician has recommended that you wear a Zio monitor.   This monitor is a medical device that records the heart's electrical activity. Doctors most often use these monitors to diagnose arrhythmias. Arrhythmias are problems with the speed or rhythm of the heartbeat. The monitor is a small device applied to your chest. You can wear one while you do your normal daily activities. While wearing this monitor if you have any symptoms to push the button and record what you felt. Once you have worn this monitor for the period of time provider prescribed (Usually 14 days), you will return the monitor device in the postage paid box. Once it is returned they will download the data collected and provide [place] with a report which the provider will then review  and we will call you with those results. Important tips:  Avoid showering during the first 24 hours of wearing the monitor. Avoid excessive sweating to help maximize wear time. Do not submerge the device, no hot tubs, and no swimming pools. Keep any lotions or oils away from the patch. After 24 hours you may shower with the patch on. Take brief showers with your back facing the shower head.  Do not remove patch once it has been placed because that will interrupt data and decrease adhesive wear time. Push the button when you have any symptoms and write down what you were feeling. Once you have completed wearing your monitor, remove and place into box which has postage paid and place in your outgoing mailbox.  If for some reason you have misplaced your box then call our office and we can provide another box and/or mail it off for you.     Follow-Up: At [facility], you and your health needs are our priority.  As part of our continuing mission to provide you with exceptional heart care, we have created designated Provider Care Teams.  These Care Teams include your primary Cardiologist (physician) and Advanced Practice Providers (APPs -  Physician Assistants and Nurse Practitioners) who all work together to provide you with the care you need, when you need it.  We recommend signing up for the patient portal called "MyChart".  Sign up information is provided on this After Visit Summary.  MyChart is used to connect with patients for Virtual Visits (Telemedicine).  Patients are able to  view lab/test results, encounter notes, upcoming appointments, etc.  Non-urgent messages can be sent to your provider as well.   To learn more about what you can do with MyChart, go to [email or web address].    Your next appointment:    After testing  Provider:   You may see Kate Sable, MD or one of the following Advanced Practice Providers on your designated Care Team:   Murray Hodgkins,  NP Christell Faith, PA-C Cadence Kathlen Mody, PA-C Gerrie Nordmann, NP
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
## 2023-04-09 ENCOUNTER — Ambulatory Visit: Payer: 59 | Attending: Cardiology

## 2023-04-09 DIAGNOSIS — R55 Syncope and collapse: Secondary | ICD-10-CM | POA: Diagnosis not present

## 2023-04-10 LAB — ECHOCARDIOGRAM COMPLETE
AR max vel: 1.63 cm2
AV Area VTI: 1.75 cm2
AV Area mean vel: 1.64 cm2
AV Mean grad: 3.8 mmHg
AV Peak grad: 7.2 mmHg
Ao pk vel: 1.34 m/s
Area-P 1/2: 3.37 cm2
S' Lateral: 2.7 cm
Single Plane A4C EF: 49.2 %

## 2023-04-12 DIAGNOSIS — R55 Syncope and collapse: Secondary | ICD-10-CM

## 2023-05-06 ENCOUNTER — Ambulatory Visit: Payer: 59 | Attending: Cardiology | Admitting: Cardiology

## 2023-05-06 ENCOUNTER — Encounter: Payer: Self-pay | Admitting: Cardiology

## 2023-05-06 VITALS — BP 128/60 | HR 69 | Ht 65.0 in | Wt 156.6 lb

## 2023-05-06 DIAGNOSIS — I10 Essential (primary) hypertension: Secondary | ICD-10-CM

## 2023-05-06 DIAGNOSIS — R55 Syncope and collapse: Secondary | ICD-10-CM | POA: Diagnosis not present

## 2023-05-06 NOTE — Patient Instructions (Signed)
Medication Instructions:   Your physician recommends that you continue on your current medications as directed. Please refer to the Current Medication list given to you today.  *If you need a refill on your cardiac medications before your next appointment, please call your pharmacy*   Lab Work:  None Ordered  If you have labs (blood work) drawn today and your tests are completely normal, you will receive your results only by: MyChart Message (if you have MyChart) OR A paper copy in the mail If you have any lab test that is abnormal or we need to change your treatment, we will call you to review the results.   Testing/Procedures:  None Ordered   Follow-Up: At [facility], you and your health needs are our priority.  As part of our continuing mission to provide you with exceptional heart care, we have created designated Provider Care Teams.  These Care Teams include your primary Cardiologist (physician) and Advanced Practice Providers (APPs -  Physician Assistants and Nurse Practitioners) who all work together to provide you with the care you need, when you need it.  We recommend signing up for the patient portal called "MyChart".  Sign up information is provided on this After Visit Summary.  MyChart is used to connect with patients for Virtual Visits (Telemedicine).  Patients are able to view lab/test results, encounter notes, upcoming appointments, etc.  Non-urgent messages can be sent to your provider as well.   To learn more about what you can do with MyChart, go to [email or web address].    Your next appointment:  AS NEEDED 
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
## 2023-06-04 ENCOUNTER — Ambulatory Visit (INDEPENDENT_AMBULATORY_CARE_PROVIDER_SITE_OTHER): Payer: 59 | Admitting: Podiatry

## 2023-06-04 VITALS — BP 134/67

## 2023-06-04 DIAGNOSIS — M79674 Pain in right toe(s): Secondary | ICD-10-CM | POA: Diagnosis not present

## 2023-06-04 DIAGNOSIS — B351 Tinea unguium: Secondary | ICD-10-CM | POA: Diagnosis not present

## 2023-06-04 DIAGNOSIS — E119 Type 2 diabetes mellitus without complications: Secondary | ICD-10-CM

## 2023-06-04 DIAGNOSIS — M79675 Pain in left toe(s): Secondary | ICD-10-CM | POA: Diagnosis not present

## 2023-06-07 ENCOUNTER — Encounter: Payer: Self-pay | Admitting: Podiatry

## 2023-06-07 NOTE — Progress Notes (Signed)
ANNUAL DIABETIC FOOT EXAM  Subjective: Vincent Bautista presents today annual diabetic foot exam.  Chief Complaint  Patient presents with   Nail Problem    DFC,Referring Provider Oswaldo Conroy, MD,LOV:12/20,A1C:5.2,taken 2 weeks ago by house calls,BS:diet controlled,not taken any diabetic medicines      Patient confirms h/o diabetes. It is diet controlled.  Patient denies any h/o foot wounds.  Patient denies any numbness, tingling, burning, or pins/needle sensation in feet.  Risk factors: diabetes, HTN, h/o tobacco use in remission.  Oswaldo Conroy, MD is patient's PCP.  Past Medical History:  Diagnosis Date   Bilateral kidney stones    BPH (benign prostatic hypertrophy)    Diabetes mellitus, type 2 (HCC)    Hematuria    gross   HTN (hypertension)    Urinary retention    Patient Active Problem List   Diagnosis Date Noted   Personal history of colonic polyps    Pain due to onychomycosis of toenails of both feet 08/31/2019   Diabetes mellitus without complication (HCC) 08/31/2019   Past Surgical History:  Procedure Laterality Date   COLONOSCOPY     COLONOSCOPY WITH PROPOFOL N/A 10/15/2022   Procedure: COLONOSCOPY WITH PROPOFOL;  Surgeon: Midge Minium, MD;  Location: [facility] ENDOSCOPY;  Service: Endoscopy;  Laterality: N/A;   none     Current Outpatient Medications on File Prior to Visit  Medication Sig Dispense Refill   aspirin 81 MG tablet Take 81 mg by mouth daily.     finasteride (PROSCAR) 5 MG tablet Take 1 tablet (5 mg total) by mouth daily. 90 tablet 3   hydrochlorothiazide (HYDRODIURIL) 25 MG tablet Take 25 mg by mouth daily.      lisinopril (PRINIVIL,ZESTRIL) 10 MG tablet Take 10 mg by mouth daily.      simvastatin (ZOCOR) 20 MG tablet Take 20 mg by mouth daily.      tadalafil (CIALIS) 20 MG tablet Take 20 mg by mouth daily.     tamsulosin (FLOMAX) 0.4 MG CAPS capsule Take 1 capsule (0.4 mg total) by mouth daily. 90 capsule 3   No current  facility-administered medications on file prior to visit.    Allergies  Allergen Reactions   Codeine Nausea And Vomiting   Social History   Occupational History   Not on file  Tobacco Use   Smoking status: Former   Smokeless tobacco: Never   Tobacco comments:    quit 30 years  Vaping Use   Vaping Use: Never used  Substance and Sexual Activity   Alcohol use: No    Alcohol/week: 0.0 standard drinks of alcohol   Drug use: No   Sexual activity: Yes    Birth control/protection: None   Family History  Problem Relation Age of Onset   Lung cancer Father    Colon cancer Brother    Heart attack Brother    Prostate cancer Brother    Cancer Other    Kidney disease Neg Hx    Bladder Cancer Neg Hx    Immunization History  Administered Date(s) Administered   Influenza,inj,quad, With Preservative 09/08/2019     Review of Systems: Negative except as noted in the HPI.   Objective: Vitals:   06/04/23 1039  BP: 134/67    EYLAN Bautista is a pleasant 77 y.o. male in NAD. AAO X 3.  Vascular Examination: Capillary refill time immediate b/l. Vascular status intact b/l with palpable pedal pulses. Pedal hair present b/l. No pain with calf compression b/l. Skin temperature  gradient WNL b/l. No cyanosis or clubbing b/l. No ischemia or gangrene noted b/l. No edema noted b/l LE.  Neurological Examination: Sensation grossly intact b/l with 10 gram monofilament. Vibratory sensation intact b/l.   Dermatological Examination: Pedal skin with normal turgor, texture and tone b/l.  No open wounds. No interdigital macerations.   Toenails 1-5 b/l thick, discolored, elongated with subungual debris and pain on dorsal palpation.   No hyperkeratotic nor porokeratotic lesions present on today's visit.  Musculoskeletal Examination: Normal muscle strength 5/5 to all lower extremity muscle groups bilaterally. No pain, crepitus or joint limitation noted with ROM b/l LE. No gross bony pedal deformities  b/l. Patient ambulates independently without assistive aids.  Radiographs: None  ADA Risk Categorization: Low Risk :  Patient has all of the following: Intact protective sensation No prior foot ulcer  No severe deformity Pedal pulses present  Assessment: 1. Pain due to onychomycosis of toenails of both feet   2. Diabetes mellitus without complication (HCC)   3. Encounter for diabetic foot exam [facility])     Plan: -Patient was evaluated and treated. All patient's and/or POA's questions/concerns answered on today's visit. -Diabetic foot examination performed today. -Discussed and educated patient on diabetic foot care, especially with  regards to the vascular, neurological and musculoskeletal systems. -Continue diabetic foot care principles: inspect feet daily, monitor glucose as recommended by PCP and/or Endocrinologist, and follow prescribed diet per PCP, Endocrinologist and/or dietician. -Patient to continue soft, supportive shoe gear daily. -Toenails 1-5 b/l were debrided in length and girth with sterile nail nippers and dremel without iatrogenic bleeding.  -Patient/POA to call should there be question/concern in the interim. Return in about 3 months (around 09/04/2023).  Vincent Bautista, DPM

## 2023-06-21 ENCOUNTER — Telehealth: Payer: Self-pay | Admitting: Cardiology
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
## 2023-06-21 NOTE — Telephone Encounter (Signed)
Patient states he received a letter from [facility]. In the letter they were requesting "additional information" to prove that the patient is fit to drive.   Phone#: [phone]

## 2023-06-22 NOTE — Telephone Encounter (Signed)
Patient is returning call.  °

## 2023-06-22 NOTE — Telephone Encounter (Signed)
Left VM advising patient to call back 

## 2023-06-25 NOTE — Telephone Encounter (Signed)
Attempted to call the patient. No answer- I left a message to please call back.  

## 2023-06-29 ENCOUNTER — Telehealth: Payer: Self-pay | Admitting: Cardiology

## 2023-06-29 NOTE — Telephone Encounter (Signed)
Patient brought in forms to be signed and CMA [place] S attempted to speak to patient.    06/29/23 11:02 AM Note Called patient. Left detailed message on VM - okay per DPR   Making patient aware that we need the remaining pages to the form to fill out before we can do anything with the paperwork.

## 2023-06-29 NOTE — Telephone Encounter (Signed)
Called patient. Left detailed message on VM - okay per DPR  Making patient aware that we need the remaining pages to the form to fill out before we can do anything with the paperwork.

## 2023-06-29 NOTE — Telephone Encounter (Signed)
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 Forms placed in nurse box.

## 2023-07-05 NOTE — Telephone Encounter (Signed)
Patient returned call.  Made patient aware that we only have page one of the form and we are missing pages 2-7. Patient reports that he will look for the other pages.

## 2023-07-12 ENCOUNTER — Telehealth: Payer: Self-pay | Admitting: Cardiology

## 2023-07-12 NOTE — Telephone Encounter (Signed)
Patient has been made aware that we do not see where anyone from this office called him.

## 2023-07-12 NOTE — Telephone Encounter (Signed)
Patient is returning phone call.  °

## 2023-08-03 ENCOUNTER — Telehealth: Payer: Self-pay | Admitting: Cardiology

## 2023-08-03 NOTE — Telephone Encounter (Signed)
Patient advised that I see no documentation where he was called by someone in our office today.

## 2023-08-03 NOTE — Telephone Encounter (Signed)
Patient states he is returning a call. 

## 2023-08-04 ENCOUNTER — Telehealth: Payer: Self-pay | Admitting: Cardiology

## 2023-08-04 NOTE — Telephone Encounter (Signed)
Patient called to follow-up on getting a letter sent to [facility] to get his driving privileges re-instated.

## 2023-08-04 NOTE — Telephone Encounter (Signed)
Spoke with patient.  Made him aware that I did fax the letter to the [facility] 07/06/2023. Patient requested me to refax it to them. Just refaxed it and received a successful notice.   Patient was thankful for the return call.

## 2023-09-13 ENCOUNTER — Ambulatory Visit: Payer: 59 | Admitting: Podiatry

## 2023-09-23 ENCOUNTER — Encounter: Payer: Self-pay | Admitting: Podiatry

## 2023-09-23 ENCOUNTER — Ambulatory Visit: Payer: 59 | Admitting: Podiatry

## 2023-09-23 DIAGNOSIS — B351 Tinea unguium: Secondary | ICD-10-CM

## 2023-09-23 DIAGNOSIS — M79675 Pain in left toe(s): Secondary | ICD-10-CM

## 2023-09-23 DIAGNOSIS — M79674 Pain in right toe(s): Secondary | ICD-10-CM

## 2023-09-23 DIAGNOSIS — E119 Type 2 diabetes mellitus without complications: Secondary | ICD-10-CM

## 2023-09-23 NOTE — Progress Notes (Signed)
Subjective:  Patient ID: Vincent Bautista, male    DOB: October 14, 1946,  MRN: [id number]  77 y.o. male presents preventative diabetic foot care and painful elongated mycotic toenails 1-5 bilaterally which are tender when wearing enclosed shoe gear. Pain is relieved with periodic professional debridement. Chief Complaint  Patient presents with   RFC     RFC     New problem(s): None   PCP is Bender, Gennaro Lagos, MD.  Allergies  Allergen Reactions   Codeine Nausea And Vomiting    Review of Systems: Negative except as noted in the HPI.   Objective:  Vincent Bautista is a pleasant 77 y.o. male WD, WN in NAD.Marland Kitchen AAO x 3.  Vascular Examination: Vascular status intact b/l with palpable pedal pulses. CFT immediate b/l. Pedal hair present. No edema. No pain with calf compression b/l. Skin temperature gradient WNL b/l. No varicosities noted. No cyanosis or clubbing noted.  Neurological Examination: Sensation grossly intact b/l with 10 gram monofilament. Vibratory sensation intact b/l.  Dermatological Examination: Pedal skin with normal turgor, texture and tone b/l. No open wounds nor interdigital macerations noted. Toenails 1-5 b/l thick, discolored, elongated with subungual debris and pain on dorsal palpation. No hyperkeratotic lesions noted b/l.   Musculoskeletal Examination: Muscle strength 5/5 to b/l LE.  No pain, crepitus noted b/l. No gross pedal deformities. Patient ambulates independently without assistive aids.   Radiographs: None   Assessment:   1. Pain due to onychomycosis of toenails of both feet   2. Diabetes mellitus without complication (HCC)    Plan:  -Consent given for treatment as described below: -Examined patient. -Continue foot and shoe inspections daily. Monitor blood glucose per PCP/Endocrinologist's recommendations. -Continue supportive shoe gear daily. -Mycotic toenails 1-5 bilaterally were debrided in length and girth with sterile nail nippers and dremel without  incident. -Patient/POA to call should there be question/concern in the interim.  Return in about 3 months (around 12/24/2023).  Freddie Breech, DPM

## 2023-10-06 NOTE — Progress Notes (Deleted)
1:12 PM   Vincent Bautista [facility] 11/27/1946 [id number]  Referring provider: Oswaldo Conroy, MD [address] [place],  [place] [address]  Urological history: 1. 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 High risk hematuria -former smoker -CTU and cysto 2015 - bilateral punctate stones, enlarged prostate, a bladder diverticulum with hypospadias -CTU and cysto 2019 - bilateral punctate stones, moderate trabeculation; large posterior wall bladder diverticulum. Urine cytology was negative  4. Nephrolithiasis -bilateral punctate stones CTU 2019  5.  Epididymitis -Last occurrence 2020  6. ED -contributing factors of age, HTN, diabetes, BPH and smoking history -failed PDE5i's   No chief complaint on file.   HPI: Vincent Bautista is a 77 y.o. male who presents today for yearly visit.    Previous records reviewed.   UA ***  PVR ***   PMH: Past Medical History:  Diagnosis Date   Bilateral kidney stones    BPH (benign prostatic hypertrophy)    Diabetes mellitus, type 2 (HCC)    Hematuria    gross   HTN (hypertension)    Urinary retention     Surgical History: Past Surgical History:  Procedure Laterality Date   COLONOSCOPY     COLONOSCOPY WITH PROPOFOL N/A 10/15/2022   Procedure: COLONOSCOPY WITH PROPOFOL;  Surgeon: Midge Minium, MD;  Location: [facility] ENDOSCOPY;  Service: Endoscopy;  Laterality: N/A;   none      Home Medications:  Allergies as of 10/08/2023       Reactions   Codeine Nausea And Vomiting        Medication List        Accurate as of October 06, 2023  1:12 PM. If you have any questions, ask your nurse or doctor.          aspirin 81 MG tablet Take 81 mg by mouth daily.   finasteride 5 MG tablet Commonly known as: PROSCAR Take 1 tablet (5 mg total) by mouth daily.   hydrochlorothiazide  25 MG tablet Commonly known as: HYDRODIURIL Take 25 mg by mouth daily.   lisinopril 10 MG tablet Commonly known as: ZESTRIL Take 10 mg by mouth daily.   simvastatin 20 MG tablet Commonly known as: ZOCOR Take 20 mg by mouth daily.   tadalafil 20 MG tablet Commonly known as: CIALIS Take 20 mg by mouth daily.   tamsulosin 0.4 MG Caps capsule Commonly known as: FLOMAX Take 1 capsule (0.4 mg total) by mouth daily.        Allergies:  Allergies  Allergen Reactions   Codeine Nausea And Vomiting    Family History: Family History  Problem Relation Age of Onset   Lung cancer Father    Colon cancer Brother    Heart attack Brother    Prostate cancer Brother    Cancer Other    Kidney disease Neg Hx    Bladder Cancer Neg Hx     Social History:  reports that he has quit smoking. He has never used smokeless tobacco. He reports that he does not drink alcohol and does not use drugs.  ROS: For pertinent review of systems please refer to history of present illness  Physical Exam: There were no vitals taken for this visit.  Constitutional:  Well nourished. Alert and oriented, No acute distress. HEENT: [place] AT, moist mucus membranes.  Trachea midline, no masses. Cardiovascular:  No clubbing, cyanosis, or edema. Respiratory: Normal respiratory effort, no increased work of breathing. GI: Abdomen is soft, non tender, non distended, no abdominal masses. Liver and spleen not palpable.  No hernias appreciated.  Stool sample for occult testing is not indicated.   GU: No CVA tenderness.  No bladder fullness or masses.  Patient with circumcised/uncircumcised phallus. ***Foreskin easily retracted***  Urethral meatus is patent.  No penile discharge. No penile lesions or rashes. Scrotum without lesions, cysts, rashes and/or edema.  Testicles are located scrotally bilaterally. No masses are appreciated in the testicles. Left and right epididymis are normal. Rectal: Patient with  normal sphincter  tone. Anus and perineum without scarring or rashes. No rectal masses are appreciated. Prostate is approximately *** grams, *** nodules are appreciated. Seminal vesicles are normal. Skin: No rashes, bruises or suspicious lesions. Lymph: No cervical or inguinal adenopathy. Neurologic: Grossly intact, no focal deficits, moving all 4 extremities. Psychiatric: Normal mood and affect.   Laboratory Data: BMET    Component Value Date/Time   NA 137 01/21/2023 1940   NA 138 12/02/2014 0423   K 3.5 01/21/2023 1940   K 4.1 12/02/2014 0423   CL 102 01/21/2023 1940   CL 107 12/02/2014 0423   CO2 24 01/21/2023 1940   CO2 25 12/02/2014 0423   GLUCOSE 123 (H) 01/21/2023 1940   GLUCOSE 135 (H) 12/02/2014 0423   BUN 21 01/21/2023 1940   BUN 25 02/16/2019 1020   BUN 34 (H) 12/02/2014 0423   CREATININE 1.34 (H) 01/21/2023 1940   CREATININE 2.01 (H) 12/02/2014 0423   CALCIUM 9.6 01/21/2023 1940   CALCIUM 7.9 (L) 12/02/2014 0423   GFRNONAA 56 (L) 01/21/2023 1940   GFRNONAA 36 (L) 12/02/2014 0423    CBC    Component Value Date/Time   WBC 9.0 01/21/2023 1940   RBC 4.39 01/21/2023 1940   HGB 12.4 (L) 01/21/2023 1940   HGB 10.7 (L) 12/02/2014 0423   HCT 38.6 (L) 01/21/2023 1940   HCT 31.9 (L) 12/02/2014 0423   PLT 230 01/21/2023 1940   PLT 206 12/02/2014 0423   MCV 87.9 01/21/2023 1940   MCV 86 12/02/2014 0423   MCH 28.2 01/21/2023 1940   MCHC 32.1 01/21/2023 1940   RDW 13.4 01/21/2023 1940   RDW 13.8 12/02/2014 0423   LYMPHSABS 1.5 08/02/2019 0104   LYMPHSABS 1.1 12/02/2014 0423   MONOABS 1.2 (H) 08/02/2019 0104   MONOABS 1.0 12/02/2014 0423   EOSABS 0.3 08/02/2019 0104   EOSABS 0.0 12/02/2014 0423   BASOSABS 0.1 08/02/2019 0104   BASOSABS 0.0 12/02/2014 0423    Urinalysis See EPIC and HPI I have reviewed the labs.  See HPI.     Pertinent Imaging ***   Assessment & Plan:    1. BPH with LUTS -continue conservative management, avoiding bladder irritants and timed  voiding's -Continue tamsulosin 0.4 mg daily and finasteride 5 mg daily   2. High risk hematuria -former smoker -Hematuria work up completed in 07/2018 - findings positive for moderate trabeculation; large posterior wall bladder diverticulum. -No report of gross hematuria  -UA ***   No follow-ups on file.  These notes generated with voice recognition software. I apologize for typographical errors.  Cloretta Ned  [facility] [address] [place], [place] [address] [phone]

## 2023-10-08 ENCOUNTER — Ambulatory Visit: Payer: 59 | Admitting: Urology

## 2023-10-08 DIAGNOSIS — R319 Hematuria, unspecified: Secondary | ICD-10-CM

## 2023-10-08 DIAGNOSIS — N401 Enlarged prostate with lower urinary tract symptoms: Secondary | ICD-10-CM

## 2023-11-15 ENCOUNTER — Other Ambulatory Visit: Payer: Self-pay | Admitting: Urology

## 2023-11-15 ENCOUNTER — Other Ambulatory Visit: Payer: Self-pay

## 2023-11-15 DIAGNOSIS — N401 Enlarged prostate with lower urinary tract symptoms: Secondary | ICD-10-CM

## 2023-11-15 MED ORDER — TAMSULOSIN HCL 0.4 MG PO CAPS: 0.4000 mg | ORAL_CAPSULE | Freq: Every day | ORAL | 0 refills | Status: DC

## 2023-11-15 MED ORDER — FINASTERIDE 5 MG PO TABS: 5.0000 mg | ORAL_TABLET | Freq: Every day | ORAL | 0 refills | Status: DC

## 2023-12-01 NOTE — Progress Notes (Signed)
12/07/2023 9:25 AM   Vincent Bautista 1946/11/11 [id number]  Referring provider: Oswaldo Conroy, MD [address] [place],  [place] [address]  Urological history: 1. 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 High risk hematuria -former smoker -CTU and cysto 2015 - bilateral punctate stones, enlarged prostate, a bladder diverticulum with hypospadias -CTU and cysto 2019 - bilateral punctate stones, moderate trabeculation; large posterior wall bladder diverticulum. Urine cytology was negative  4. Nephrolithiasis -bilateral punctate stones CTU 2019  5.  Epididymitis -Last occurrence 2020  6. ED -contributing factors of age, HTN, diabetes, BPH and smoking history -failed PDE5i's   Chief Complaint  Patient presents with   Benign Prostatic Hypertrophy   HPI: Vincent Bautista is a 77 y.o. male who presents today for one year follow up.   Previous records reviewed.   He takes tadalafil 20 mg on-demand dosing for ED prescribed through his PCP.  He states it works well.  He has not had any urinary issues.  He is still self cathing twice daily.  He states he has plenty of catheter supplies on hand.  He is still taking the finasteride 5 mg daily and the tamsulosin 0.4 mg daily.  Patient denies any modifying or aggravating factors.  Patient denies any recent UTI's, gross hematuria, dysuria or suprapubic/flank pain.  Patient denies any fevers, chills, nausea or vomiting.    UA no micro heme.  It is a refrigerated sample that he produced this am, so we will not send for culture as he is not having UTI symptoms.   PVR 79 mL   PMH: Past Medical History:  Diagnosis Date   Bilateral kidney stones    BPH (benign prostatic hypertrophy)    Diabetes mellitus, type 2 (HCC)    Hematuria    gross   HTN (hypertension)    Urinary  retention     Surgical History: Past Surgical History:  Procedure Laterality Date   COLONOSCOPY     COLONOSCOPY WITH PROPOFOL N/A 10/15/2022   Procedure: COLONOSCOPY WITH PROPOFOL;  Surgeon: Midge Minium, MD;  Location: [facility] ENDOSCOPY;  Service: Endoscopy;  Laterality: N/A;   none      Home Medications:  Allergies as of 12/07/2023       Reactions   Codeine Nausea And Vomiting        Medication List        Accurate as of December 07, 2023  9:25 AM. If you have any questions, ask your nurse or doctor.          aspirin 81 MG tablet Take 81 mg by mouth daily.   finasteride 5 MG tablet Commonly known as: PROSCAR Take 1 tablet (5 mg total) by mouth daily.   hydrochlorothiazide 25 MG tablet Commonly known as: HYDRODIURIL Take 25 mg by mouth daily.   lisinopril 10 MG tablet Commonly known as: ZESTRIL Take 10 mg by mouth daily.   simvastatin 20 MG tablet Commonly known as: ZOCOR Take 20 mg by mouth daily.   tadalafil 20 MG tablet Commonly known as: CIALIS Take 20 mg by mouth daily.   tamsulosin 0.4 MG Caps capsule Commonly known as: FLOMAX Take 1 capsule (0.4 mg total) by mouth daily.        Allergies:  Allergies  Allergen Reactions   Codeine Nausea And Vomiting    Family History:  Family History  Problem Relation Age of Onset   Lung cancer Father    Colon cancer Brother    Heart attack Brother    Prostate cancer Brother    Cancer Other    Kidney disease Neg Hx    Bladder Cancer Neg Hx     Social History:  reports that he has quit smoking. He has never used smokeless tobacco. He reports that he does not drink alcohol and does not use drugs.  ROS: Pertinent ROS in HPI  Physical Exam: BP 120/64   Pulse 92   Ht 5\' 5"  (1.651 m)   Wt 174 lb (78.9 kg)   BMI 28.96 kg/m   Constitutional:  Well nourished. Alert and oriented, No acute distress. HEENT: [place] AT, moist mucus membranes.  Trachea midline Cardiovascular: No clubbing, cyanosis, or  edema. Respiratory: Normal respiratory effort, no increased work of breathing. Neurologic: Grossly intact, no focal deficits, moving all 4 extremities. Psychiatric: Normal mood and affect.  Laboratory Data: Lab Results  Component Value Date   WBC 9.0 01/21/2023   HGB 12.4 (L) 01/21/2023   HCT 38.6 (L) 01/21/2023   MCV 87.9 01/21/2023   PLT 230 01/21/2023    Lab Results  Component Value Date   CREATININE 1.34 (H) 01/21/2023    Urinalysis: See EPIC and HPI I have reviewed the labs.   Pertinent Imaging:  12/07/23 09:07  Scan Result 79    Assessment & Plan:    1. BPH with LUTS -continue conservative management, avoiding bladder irritants and timed voiding's -Continue tamsulosin 0.4 mg daily and finasteride 5 mg daily  2.  Incomplete bladder emptying -Continue self-catheterization twice daily indefinitely   3. High risk hematuria -former smoker -Hematuria work up completed in 07/2018 - findings positive for moderate trabeculation; large posterior wall bladder diverticulum. -No report of gross hematuria  -UA no micro heme   4. Retrograde ejaculation -Patient and I discussed the mechanism of action of his tamsulosin and finasteride.  I explained to him that the tamsulosin is in alpha-blocker which relaxes the smooth muscle encircling the prostate and bladder neck.  This results in an opening of the prostatic urethra and bladder neck facilitating emptying of the bladder.  The finasteride blocks the testosterone stimulation of the prostate therefore maintaining the size of the prostate or in some cases decreasing the size of the prostate.  It is true that some patients lower urinary tract symptoms can be managed with 1 or the other, but like blood pressure medications, some patients will need to address their lower urinary tract symptoms with the 2 different mechanisms of actions.  In relation to the ejaculatory disorder and the reduction of semen volume, I had a frank discussion  with the patient that as a man ages and the prostate is enlarged is in size, it can be expected that the amount of semen and a forceful ejaculation can be reduced.  This can occur even without medication as the prostate will enlarge as a man ages.  He has the options to stop the tamsulosin and finasteride and see if his sexual function and ejaculatory disorders improve, but his urinary symptoms may worsen.   -He expressed that he would just like to stay on his BPH medication as his retrograde ejaculation is not bothersome  Return in about 1 year (around 12/06/2024) for UA and PVR .  These notes generated with voice recognition software. I apologize for typographical errors.  Cloretta Ned  [facility] [phone]  [address]  [address] [place], [place] [address] [phone]

## 2023-12-07 ENCOUNTER — Ambulatory Visit: Payer: 59 | Admitting: Urology

## 2023-12-07 ENCOUNTER — Encounter: Payer: Self-pay | Admitting: Urology

## 2023-12-07 VITALS — BP 120/64 | HR 92 | Ht 65.0 in | Wt 174.0 lb

## 2023-12-07 DIAGNOSIS — N401 Enlarged prostate with lower urinary tract symptoms: Secondary | ICD-10-CM

## 2023-12-07 DIAGNOSIS — R339 Retention of urine, unspecified: Secondary | ICD-10-CM | POA: Diagnosis not present

## 2023-12-07 DIAGNOSIS — Z87898 Personal history of other specified conditions: Secondary | ICD-10-CM | POA: Diagnosis not present

## 2023-12-07 DIAGNOSIS — N5314 Retrograde ejaculation: Secondary | ICD-10-CM

## 2023-12-07 DIAGNOSIS — R319 Hematuria, unspecified: Secondary | ICD-10-CM

## 2023-12-07 LAB — URINALYSIS, COMPLETE
Bilirubin, UA: NEGATIVE
Glucose, UA: NEGATIVE
Ketones, UA: NEGATIVE
Nitrite, UA: POSITIVE — AB
Protein,UA: NEGATIVE
RBC, UA: NEGATIVE
Specific Gravity, UA: 1.02 (ref 1.005–1.030)
Urobilinogen, Ur: 0.2 mg/dL (ref 0.2–1.0)
pH, UA: 5.5 (ref 5.0–7.5)

## 2023-12-07 LAB — MICROSCOPIC EXAMINATION

## 2023-12-07 LAB — BLADDER SCAN AMB NON-IMAGING: Scan Result: 79

## 2023-12-07 MED ORDER — TAMSULOSIN HCL 0.4 MG PO CAPS: 0.4000 mg | ORAL_CAPSULE | Freq: Every day | ORAL | 3 refills | Status: DC

## 2023-12-07 MED ORDER — FINASTERIDE 5 MG PO TABS: 5.0000 mg | ORAL_TABLET | Freq: Every day | ORAL | 3 refills | Status: DC

## 2023-12-24 ENCOUNTER — Ambulatory Visit (INDEPENDENT_AMBULATORY_CARE_PROVIDER_SITE_OTHER): Payer: 59 | Admitting: Podiatry

## 2023-12-24 ENCOUNTER — Encounter: Payer: Self-pay | Admitting: Podiatry

## 2023-12-24 VITALS — Ht 65.0 in | Wt 174.0 lb

## 2023-12-24 DIAGNOSIS — M79674 Pain in right toe(s): Secondary | ICD-10-CM

## 2023-12-24 DIAGNOSIS — B351 Tinea unguium: Secondary | ICD-10-CM | POA: Diagnosis not present

## 2023-12-24 DIAGNOSIS — M79675 Pain in left toe(s): Secondary | ICD-10-CM

## 2023-12-24 NOTE — Progress Notes (Signed)
  Subjective:  Patient ID: Vincent Bautista, male    DOB: [birth year],  MRN: [id number]  78 y.o. male presents preventative diabetic foot care and painful elongated mycotic toenails 1-5 bilaterally which are tender when wearing enclosed shoe gear. Pain is relieved with periodic professional debridement.  Chief Complaint  Patient presents with   Nail Problem    Patient is here for RFC   New problem(s): None   PCP is Bender, Stefano Iles, MD.  Allergies  Allergen Reactions   Codeine Nausea And Vomiting    Review of Systems: Negative except as noted in the HPI.   Objective:  Vincent Bautista is a pleasant 78 y.o. male WD, WN in NAD. AAO x 3.  Vascular Examination: Capillary refill time immediate b/l. Vascular status intact b/l with palpable pedal pulses. Pedal hair present b/l. No pain with calf compression b/l. Skin temperature gradient WNL b/l. No cyanosis or clubbing b/l. No ischemia or gangrene noted b/l.   Neurological Examination: Sensation grossly intact b/l with 10 gram monofilament. Vibratory sensation intact b/l.   Dermatological Examination: Pedal skin with normal turgor, texture and tone b/l.  No open wounds. No interdigital macerations.   Toenails 1-5 b/l thick, discolored, elongated with subungual debris and pain on dorsal palpation.   No corns, calluses nor porokeratotic lesions noted.  Musculoskeletal Examination: Normal muscle strength 5/5 to all lower extremity muscle groups bilaterally. No pain, crepitus or joint limitation noted with ROM b/l LE. No gross bony pedal deformities b/l. Patient ambulates independently without assistive aids.  Radiographs: None  Assessment:   1. Pain due to onychomycosis of toenails of both feet    Plan:  Patient was evaluated and treated. All patient's and/or POA's questions/concerns addressed on today's visit. Toenails 1-5 debrided in length and girth without incident. Continue soft, supportive shoe gear daily. Report any pedal  injuries to medical professional. Call office if there are any questions/concerns. -Continue foot and shoe inspections daily. Monitor blood glucose per PCP/Endocrinologist's recommendations. -Patient/POA to call should there be question/concern in the interim.  Return in about 3 months (around 03/23/2024).  Vincent Bautista, DPM      [place] LOCATION: [address] [place] [address]                   Office [phone]   [facility] LOCATION: [address] [place], [place] [address] Office [phone]

## 2024-03-23 ENCOUNTER — Ambulatory Visit (INDEPENDENT_AMBULATORY_CARE_PROVIDER_SITE_OTHER): Payer: 59 | Admitting: Podiatry

## 2024-03-23 DIAGNOSIS — Z91199 Patient's noncompliance with other medical treatment and regimen due to unspecified reason: Secondary | ICD-10-CM

## 2024-03-23 NOTE — Progress Notes (Signed)
 1. No-show for appointment

## 2024-08-07 ENCOUNTER — Encounter: Payer: Self-pay | Admitting: Podiatry

## 2024-08-07 ENCOUNTER — Ambulatory Visit (INDEPENDENT_AMBULATORY_CARE_PROVIDER_SITE_OTHER): Admitting: Podiatry

## 2024-08-07 VITALS — BP 155/55 | HR 67

## 2024-08-07 DIAGNOSIS — M79675 Pain in left toe(s): Secondary | ICD-10-CM

## 2024-08-07 DIAGNOSIS — B351 Tinea unguium: Secondary | ICD-10-CM

## 2024-08-07 DIAGNOSIS — E119 Type 2 diabetes mellitus without complications: Secondary | ICD-10-CM | POA: Diagnosis not present

## 2024-08-07 DIAGNOSIS — M79674 Pain in right toe(s): Secondary | ICD-10-CM

## 2024-08-12 ENCOUNTER — Encounter: Payer: Self-pay | Admitting: Podiatry

## 2024-08-12 NOTE — Progress Notes (Signed)
  Subjective:  Patient ID: Vincent Bautista, male    DOB: 07/10/1946,  MRN: [id number]  Vincent Bautista presents to clinic today for preventative diabetic foot care for painful thick toenails that are difficult to trim. Pain interferes with ambulation. Aggravating factors include wearing enclosed shoe gear. Pain is relieved with periodic professional debridement.  Chief Complaint  Patient presents with   RFC     RFC Non diabetic toenail trim. LOV with PCP 07/2024.   New problem(s): None.   PCP is Bender, Stefano Iles, MD.  Allergies  Allergen Reactions   Codeine Nausea And Vomiting    Review of Systems: Negative except as noted in the HPI.  Objective: No changes noted in today's physical examination. Vitals:   08/07/24 1030  BP: (!) 155/55  Pulse: 67   Vincent Bautista is a pleasant 78 y.o. male WD, WN in NAD. AAO x 3.  Vascular Examination: Capillary refill time immediate b/l. Palpable pedal pulses. Pedal hair present b/l. Pedal edema absent. No pain with calf compression b/l. Skin temperature gradient WNL b/l. No cyanosis or clubbing b/l. No ischemia or gangrene noted b/l.   Neurological Examination: Sensation grossly intact b/l with 10 gram monofilament. Vibratory sensation intact b/l.   Dermatological Examination: Pedal skin with normal turgor, texture and tone b/l.  No open wounds. No interdigital macerations.   Toenails 1-5 b/l thick, discolored, elongated with subungual debris and pain on dorsal palpation.   No hyperkeratotic nor porokeratotic lesions.  Musculoskeletal Examination: Muscle strength 5/5 to all lower extremity muscle groups bilaterally. No pain, crepitus or joint limitation noted with ROM b/l LE. No gross bony pedal deformities b/l. Patient ambulates independently without assistive aids.  Radiographs: None  Assessment/Plan: 1. Pain due to onychomycosis of toenails of both feet   2. Diabetes mellitus without complication [facility])   Consent given for treatment.  Patient examined. All patient's and/or POA's questions/concerns addressed on today's visit. Mycotic toenails 1-5 debrided in length and girth without incident. Treatment was provided by assistant Andrez Manchester under my supervision. Continue foot and shoe inspections daily. Monitor blood glucose per PCP/Endocrinologist's recommendations.Continue soft, supportive shoe gear daily. Report any pedal injuries to medical professional. Call office if there are any quesitons/concerns.  Return in about 3 months (around 11/07/2024).  [name], DPM      [place] LOCATION: [address], [place] [address]                   Office [phone]   [facility] LOCATION: [address] [place], [place] [address] Office [phone]
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
## 2024-11-03 ENCOUNTER — Encounter: Payer: Self-pay | Admitting: Medical

## 2024-11-03 ENCOUNTER — Ambulatory Visit: Attending: Medical | Admitting: Medical

## 2024-11-03 VITALS — BP 134/64 | HR 67 | Ht 64.0 in | Wt 154.4 lb

## 2024-11-03 DIAGNOSIS — R55 Syncope and collapse: Secondary | ICD-10-CM | POA: Diagnosis not present

## 2024-11-03 DIAGNOSIS — I10 Essential (primary) hypertension: Secondary | ICD-10-CM | POA: Diagnosis not present

## 2024-11-03 NOTE — Patient Instructions (Signed)
 Medication Instructions:  Your physician recommends that you continue on your current medications as directed. Please refer to the Current Medication list given to you today.   *If you need a refill on your cardiac medications before your next appointment, please call your pharmacy*  Lab Work: No labs ordered today   Testing/Procedures: No test ordered today   Follow-Up: At [facility], you and your health needs are our priority.  As part of our continuing mission to provide you with exceptional heart care, our providers are all part of one team.  This team includes your primary Cardiologist (physician) and Advanced Practice Providers or APPs (Physician Assistants and Nurse Practitioners) who all work together to provide you with the care you need, when you need it.  Your next appointment:   1 year(s)  Provider:   You may see Constancia Delton, MD or one of the following Advanced Practice Providers on your designated Care Team:   Laneta Pintos, NP Gildardo Labrador, PA-C Varney Gentleman, PA-C Cadence Manville, PA-C Ronald Cockayne, NP Morey Ar, NP

## 2024-11-13 ENCOUNTER — Ambulatory Visit (INDEPENDENT_AMBULATORY_CARE_PROVIDER_SITE_OTHER): Admitting: Podiatry

## 2024-11-13 DIAGNOSIS — Z91198 Patient's noncompliance with other medical treatment and regimen for other reason: Secondary | ICD-10-CM

## 2024-11-13 NOTE — Progress Notes (Signed)
 1. Failure to attend appointment with reason given    Appointment canceled by patient.

## 2024-11-29 NOTE — Progress Notes (Signed)
 12/06/2024 10:01 AM   Vincent Bautista 1946-01-20 [id number]  Referring provider: Kandis Stefano Iles, MD [address] [place] [address] [place],  [place] [address]  Urological history: 1. 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 High risk hematuria -former smoker -CTU and cysto 2015 - bilateral punctate stones, enlarged prostate, a bladder diverticulum with hypospadias -CTU and cysto 2019 - bilateral punctate stones, moderate trabeculation; large posterior wall bladder diverticulum. Urine cytology was negative  4. Nephrolithiasis -bilateral punctate stones CTU 2019  5.  Epididymitis -Last occurrence 2020  6. ED -contributing factors of age, HTN, diabetes, BPH and smoking history -failed PDE5i's   Chief Complaint  Patient presents with   Benign Prostatic Hypertrophy    lower urinary tract symptoms, symptom details unspecified   HPI: Vincent Bautista is a 78 y.o. male who presents today for one year follow up.   Previous records reviewed.   He states that last week he started experiencing dysuria.  He also has discontinued soda consumption as he feels drinking soda causes some discomfort when urinating as well.  He is only drinking water.  He also has been having issues with constipation, but it seems to be resolving now that his increase his water intake.  I PSS 11/3  He reports sensation of incomplete bladder emptying, urinary frequency, urinary intermittency, urinary urgency, a weak urinary stream, and nocturia x 1.  He continues to cath twice daily.   Patient denies any modifying or aggravating factors.  Patient denies any gross hematuria or suprapubic/flank pain.  Patient denies any fevers, chills, nausea or vomiting.    UA yellow cloudy, specific gravity 1.010, pH 8.5, 1+ protein,  trace heme, 3+ leukocyte, greater than 30 WBCs, 3-10 RBCs, 0-10 epithelial cells, triple phosphate crystals present, mucus threads present and many bacteria.  PVR 160 mL   SHIM 7  He does not have confidence that he could get and keep an erection, his erections are not firm enough for penetrative intercourse, he has difficulty maintaining his erections,  and he is not finding intercourse satisfactory for him.    Patient still is not having spontaneous erections.  He denies any pain or curvature with erections.  He is having bothersome retrograde ejaculation.   PMH: Past Medical History:  Diagnosis Date   Bilateral kidney stones    BPH (benign prostatic hypertrophy)    Diabetes mellitus, type 2 (HCC)    Hematuria    gross   HTN (hypertension)    Urinary retention     Surgical History: Past Surgical History:  Procedure Laterality Date   COLONOSCOPY     COLONOSCOPY WITH PROPOFOL  N/A 10/15/2022   Procedure: COLONOSCOPY WITH PROPOFOL ;  Surgeon: Jinny Carmine, MD;  Location: [facility] ENDOSCOPY;  Service: Endoscopy;  Laterality: N/A;   none      Home Medications:  Allergies as of 12/06/2024       Reactions   Codeine Nausea And Vomiting   Other Reaction(s): hives        Medication List        Accurate as of December 06, 2024 10:01 AM. If you have any questions, ask your nurse or doctor.          amoxicillin -clavulanate 875-125 MG tablet Commonly known as: AUGMENTIN  Take 1 tablet by mouth every 12 (twelve)  hours. Started by: CLOTILDA CORNWALL   aspirin 81 MG tablet Take 81 mg by mouth daily.   dorzolamide-timolol 2-0.5 % ophthalmic solution Commonly known as: COSOPT SMARTSIG:In Eye(s)   finasteride  5 MG tablet Commonly known as: PROSCAR  Take 1 tablet (5 mg total) by mouth daily.   hydrochlorothiazide 25 MG tablet Commonly known as: HYDRODIURIL Take 25 mg by mouth daily.   latanoprost 0.005 % ophthalmic solution Commonly known as: XALATAN 1 drop at bedtime.    lisinopril 10 MG tablet Commonly known as: ZESTRIL Take 10 mg by mouth daily.   simvastatin 20 MG tablet Commonly known as: ZOCOR Take 20 mg by mouth daily.   tadalafil 20 MG tablet Commonly known as: CIALIS Take 20 mg by mouth daily.   tamsulosin  0.4 MG Caps capsule Commonly known as: FLOMAX  Take 1 capsule (0.4 mg total) by mouth daily.        Allergies:  Allergies  Allergen Reactions   Codeine Nausea And Vomiting    Other Reaction(s): hives    Family History: Family History  Problem Relation Age of Onset   Lung cancer Father    Colon cancer Brother    Heart attack Brother    Prostate cancer Brother    Cancer Other    Kidney disease Neg Hx    Bladder Cancer Neg Hx     Social History:  reports that he has quit smoking. He has never used smokeless tobacco. He reports that he does not drink alcohol and does not use drugs.  ROS: Pertinent ROS in HPI  Physical Exam: BP 122/65   Pulse 65   Ht 5' 3 (1.6 m)   Wt 150 lb (68 kg)   SpO2 99%   BMI 26.57 kg/m   Constitutional:  Well nourished. Alert and oriented, No acute distress. HEENT: [place] AT, moist mucus membranes.  Trachea midline Cardiovascular: No clubbing, cyanosis, or edema. Respiratory: Normal respiratory effort, no increased work of breathing. Neurologic: Grossly intact, no focal deficits, moving all 4 extremities. Psychiatric: Normal mood and affect.   Laboratory Data: See EPIC and HPI I have reviewed the labs.   Pertinent Imaging:  12/06/24 09:51  Scan Result    Assessment & Plan:    1. BPH with LU TS - moderate symptoms and he is mixed regarding  his urinary issues  - PVR < 300 cc  - encouraged avoiding bladder irritants, fluid restriction before bedtime and timed voiding's - discontinue tamsulosin  0.4 mg daily due to bothersome retrograde ejaculation -Continue finasteride  5 mg daily, refill given    2.  Incomplete bladder emptying -Continue self-catheterization twice daily  indefinitely   3. High risk hematuria -former smoker -Hematuria work up completed in 07/2018 - findings positive for moderate trabeculation; large posterior wall bladder diverticulum. -No report of gross hematuria  -UA w/ micro heme, but likely secondary to infection  4. Suspected UTI  - UA grossly infected  - Urine culture pending - Started empirically on Augmentin  875/125 twice daily for seven days, will adjust if necessary once urine culture and sensitivity results are available  - Advised patient to increase fluid intake and monitor symptoms - Counseled on UTI prevention  - follow-up or call if no improvement within 48-72 hours or if symptoms worsen (fever, back pain)  5. Microscopic/gross hematuria -Schedule repeat evaluation in 2 months to confirm resolution of hematuria. -Persistent hematuria after infection clearance may indicate underlying pathology (e.g., urinary stone, urothelial malignancy). -Next steps if hematuria persists: Consider imaging (CT urogram  or renal/bladder ultrasound) and cystoscopy for further evaluation. -Patient education: Discuss importance of follow-up to ensure hematuria resolves and to rule out serious causes. Reinforce that hematuria can be infection-related but may also signal other urologic disease.   6.  Retrograde ejaculation - Will have patient stop the tamsulosin  as this is a potential side effect of that medication and he is self cathing anyway so there is no concern for him going into urinary retention if we discontinue the medication  Return in about 2 months (around 02/06/2025) for Repeat UA and symptom recheck.  These notes generated with voice recognition software. I apologize for typographical errors.  CLOTILDA HELON RIGGERS  [facility] [address]  [address] [place], [place] [address] [phone]

## 2024-12-06 ENCOUNTER — Encounter: Payer: Self-pay | Admitting: Urology

## 2024-12-06 ENCOUNTER — Ambulatory Visit: Payer: Self-pay | Admitting: Urology

## 2024-12-06 ENCOUNTER — Telehealth: Payer: Self-pay

## 2024-12-06 VITALS — BP 122/65 | HR 65 | Ht 63.0 in | Wt 150.0 lb

## 2024-12-06 DIAGNOSIS — R3129 Other microscopic hematuria: Secondary | ICD-10-CM

## 2024-12-06 DIAGNOSIS — R339 Retention of urine, unspecified: Secondary | ICD-10-CM

## 2024-12-06 DIAGNOSIS — R319 Hematuria, unspecified: Secondary | ICD-10-CM

## 2024-12-06 DIAGNOSIS — N401 Enlarged prostate with lower urinary tract symptoms: Secondary | ICD-10-CM

## 2024-12-06 DIAGNOSIS — R3989 Other symptoms and signs involving the genitourinary system: Secondary | ICD-10-CM

## 2024-12-06 DIAGNOSIS — N5314 Retrograde ejaculation: Secondary | ICD-10-CM

## 2024-12-06 LAB — BLADDER SCAN AMB NON-IMAGING

## 2024-12-06 MED ORDER — FINASTERIDE 5 MG PO TABS: 5.0000 mg | ORAL_TABLET | Freq: Every day | ORAL | 3 refills | Status: AC

## 2024-12-06 MED ORDER — AMOXICILLIN-POT CLAVULANATE 875-125 MG PO TABS: 1.0000 | ORAL_TABLET | Freq: Two times a day (BID) | ORAL | 0 refills | Status: AC

## 2024-12-06 NOTE — Telephone Encounter (Signed)
 Spoke with pharmacy, advised to stop Tamsulosin  0.4mg  prescription per provider. Pharmacist verbalized understanding.  Andrea Kirks LPN

## 2024-12-07 LAB — URINALYSIS, COMPLETE
Bilirubin, UA: NEGATIVE
Glucose, UA: NEGATIVE
Ketones, UA: NEGATIVE
Nitrite, UA: NEGATIVE
Specific Gravity, UA: 1.01 (ref 1.005–1.030)
Urobilinogen, Ur: 0.2 mg/dL (ref 0.2–1.0)
pH, UA: 8.5 — ABNORMAL HIGH (ref 5.0–7.5)

## 2024-12-07 LAB — MICROSCOPIC EXAMINATION: WBC, UA: >30 /HPF — AB (ref 0–5)

## 2024-12-09 LAB — CULTURE, URINE COMPREHENSIVE

## 2024-12-10 ENCOUNTER — Ambulatory Visit: Payer: Self-pay | Admitting: Urology

## 2024-12-12 DIAGNOSIS — N401 Enlarged prostate with lower urinary tract symptoms: Secondary | ICD-10-CM

## 2024-12-12 MED ORDER — TAMSULOSIN HCL 0.4 MG PO CAPS: 0.4000 mg | ORAL_CAPSULE | Freq: Every day | ORAL | 3 refills | Status: AC

## 2024-12-12 NOTE — Telephone Encounter (Signed)
-----   Message from [facility] sent at 12/12/2024  8:50 AM EST ----- Would you let Vincent Bautista know that his urine culture was positive for infection and the Augmentin  that I prescribed should treat the infection.  If he has not seen an improvement of his symptoms, we  need to have him in for more testing.  Otherwise we will see him in February.

## 2024-12-12 NOTE — Telephone Encounter (Signed)
 Spoke with DPR Vincent Bautista , advised that urine culture was positive for infection. Augmentin  that was given should treat the infection. Advised if patient is still having symptoms to please contact the office. DPR verbalized understanding of information given.  Andrea Kirks LPN

## 2025-02-06 ENCOUNTER — Ambulatory Visit: Admitting: Urology
